# Patient Record
Sex: Female | Born: 1970 | Race: Black or African American | Hispanic: No | Marital: Single | Smoking: Current every day smoker
Health system: Southern US, Community
[De-identification: ages and names within clinical notes are randomized; demographics above are authoritative.]

## PROBLEM LIST (undated history)

## (undated) DIAGNOSIS — D259 Leiomyoma of uterus, unspecified: Secondary | ICD-10-CM

## (undated) DIAGNOSIS — N920 Excessive and frequent menstruation with regular cycle: Secondary | ICD-10-CM

## (undated) DIAGNOSIS — M25569 Pain in unspecified knee: Secondary | ICD-10-CM

## (undated) DIAGNOSIS — J40 Bronchitis, not specified as acute or chronic: Secondary | ICD-10-CM

## (undated) DIAGNOSIS — D649 Anemia, unspecified: Secondary | ICD-10-CM

## (undated) HISTORY — PX: TUMOR EXCISION: SHX421

## (undated) HISTORY — PX: ABDOMINAL HYSTERECTOMY: SHX81

---

## 2000-06-19 ENCOUNTER — Ambulatory Visit (HOSPITAL_BASED_OUTPATIENT_CLINIC_OR_DEPARTMENT_OTHER): Admission: RE | Admit: 2000-06-19 | Discharge: 2000-06-20 | Payer: Self-pay | Admitting: Otolaryngology

## 2000-06-19 ENCOUNTER — Encounter (INDEPENDENT_AMBULATORY_CARE_PROVIDER_SITE_OTHER): Payer: Self-pay | Admitting: Specialist

## 2001-09-26 ENCOUNTER — Emergency Department (HOSPITAL_COMMUNITY): Admission: EM | Admit: 2001-09-26 | Discharge: 2001-09-26 | Payer: Self-pay | Admitting: Internal Medicine

## 2002-02-07 ENCOUNTER — Encounter: Payer: Self-pay | Admitting: *Deleted

## 2002-02-07 ENCOUNTER — Emergency Department (HOSPITAL_COMMUNITY): Admission: EM | Admit: 2002-02-07 | Discharge: 2002-02-08 | Payer: Self-pay | Admitting: *Deleted

## 2002-02-13 ENCOUNTER — Emergency Department (HOSPITAL_COMMUNITY): Admission: EM | Admit: 2002-02-13 | Discharge: 2002-02-13 | Payer: Self-pay | Admitting: Emergency Medicine

## 2002-08-11 ENCOUNTER — Emergency Department (HOSPITAL_COMMUNITY): Admission: EM | Admit: 2002-08-11 | Discharge: 2002-08-11 | Payer: Self-pay | Admitting: Internal Medicine

## 2002-09-04 ENCOUNTER — Emergency Department (HOSPITAL_COMMUNITY): Admission: EM | Admit: 2002-09-04 | Discharge: 2002-09-04 | Payer: Self-pay | Admitting: Emergency Medicine

## 2002-09-04 ENCOUNTER — Encounter: Payer: Self-pay | Admitting: Emergency Medicine

## 2002-11-12 ENCOUNTER — Emergency Department (HOSPITAL_COMMUNITY): Admission: EM | Admit: 2002-11-12 | Discharge: 2002-11-12 | Payer: Self-pay | Admitting: Emergency Medicine

## 2002-11-12 ENCOUNTER — Encounter: Payer: Self-pay | Admitting: Emergency Medicine

## 2005-07-22 ENCOUNTER — Emergency Department (HOSPITAL_COMMUNITY): Admission: EM | Admit: 2005-07-22 | Discharge: 2005-07-22 | Payer: Self-pay | Admitting: Emergency Medicine

## 2006-07-02 ENCOUNTER — Inpatient Hospital Stay (HOSPITAL_COMMUNITY): Admission: RE | Admit: 2006-07-02 | Discharge: 2006-07-05 | Payer: Self-pay | Admitting: Obstetrics and Gynecology

## 2006-07-02 ENCOUNTER — Encounter (INDEPENDENT_AMBULATORY_CARE_PROVIDER_SITE_OTHER): Payer: Self-pay | Admitting: Specialist

## 2006-07-07 ENCOUNTER — Inpatient Hospital Stay (HOSPITAL_COMMUNITY): Admission: EM | Admit: 2006-07-07 | Discharge: 2006-07-13 | Payer: Self-pay | Admitting: Emergency Medicine

## 2006-07-21 ENCOUNTER — Ambulatory Visit (HOSPITAL_COMMUNITY): Admission: RE | Admit: 2006-07-21 | Discharge: 2006-07-21 | Payer: Self-pay | Admitting: Obstetrics and Gynecology

## 2009-05-22 ENCOUNTER — Emergency Department (HOSPITAL_COMMUNITY): Admission: EM | Admit: 2009-05-22 | Discharge: 2009-05-22 | Payer: Self-pay | Admitting: Emergency Medicine

## 2009-09-08 ENCOUNTER — Emergency Department (HOSPITAL_COMMUNITY): Admission: EM | Admit: 2009-09-08 | Discharge: 2009-09-08 | Payer: Self-pay | Admitting: Emergency Medicine

## 2009-11-17 ENCOUNTER — Emergency Department (HOSPITAL_COMMUNITY): Admission: EM | Admit: 2009-11-17 | Discharge: 2009-11-17 | Payer: Self-pay | Admitting: Emergency Medicine

## 2010-02-09 ENCOUNTER — Emergency Department (HOSPITAL_COMMUNITY): Admission: EM | Admit: 2010-02-09 | Discharge: 2010-02-09 | Payer: Self-pay | Admitting: Emergency Medicine

## 2010-03-05 ENCOUNTER — Emergency Department (HOSPITAL_COMMUNITY): Admission: EM | Admit: 2010-03-05 | Discharge: 2010-03-05 | Payer: Self-pay | Admitting: Emergency Medicine

## 2010-05-19 ENCOUNTER — Emergency Department (HOSPITAL_COMMUNITY)
Admission: EM | Admit: 2010-05-19 | Discharge: 2010-05-19 | Payer: Self-pay | Source: Home / Self Care | Admitting: Emergency Medicine

## 2010-07-23 ENCOUNTER — Emergency Department (HOSPITAL_COMMUNITY): Admission: EM | Admit: 2010-07-23 | Discharge: 2010-07-23 | Payer: Self-pay | Admitting: Emergency Medicine

## 2010-11-18 ENCOUNTER — Emergency Department (HOSPITAL_COMMUNITY)
Admission: EM | Admit: 2010-11-18 | Discharge: 2010-11-18 | Payer: Self-pay | Source: Home / Self Care | Admitting: Emergency Medicine

## 2011-01-27 LAB — DIFFERENTIAL
Basophils Absolute: 0 10*3/uL (ref 0.0–0.1)
Basophils Relative: 0 % (ref 0–1)
Eosinophils Relative: 7 % — ABNORMAL HIGH (ref 0–5)
Lymphs Abs: 3.4 10*3/uL (ref 0.7–4.0)
Monocytes Relative: 6 % (ref 3–12)
Neutrophils Relative %: 41 % — ABNORMAL LOW (ref 43–77)

## 2011-01-27 LAB — BASIC METABOLIC PANEL
BUN: 7 mg/dL (ref 6–23)
Calcium: 10.1 mg/dL (ref 8.4–10.5)
Glucose, Bld: 103 mg/dL — ABNORMAL HIGH (ref 70–99)

## 2011-01-27 LAB — POCT CARDIAC MARKERS: Myoglobin, poc: 40.5 ng/mL (ref 12–200)

## 2011-01-27 LAB — CBC: Platelets: 258 10*3/uL (ref 150–400)

## 2011-02-04 LAB — URINALYSIS, ROUTINE W REFLEX MICROSCOPIC
Ketones, ur: NEGATIVE mg/dL
Specific Gravity, Urine: <1.005 — ABNORMAL LOW (ref 1.005–1.030)
Urobilinogen, UA: 0.2 mg/dL (ref 0.0–1.0)

## 2011-02-04 LAB — WET PREP, GENITAL
Trich, Wet Prep: NONE SEEN
Yeast Wet Prep HPF POC: NONE SEEN

## 2011-02-23 LAB — COMPREHENSIVE METABOLIC PANEL
Albumin: 3.8 g/dL (ref 3.5–5.2)
CO2: 24 mEq/L (ref 19–32)
Calcium: 9.6 mg/dL (ref 8.4–10.5)
Creatinine, Ser: 0.68 mg/dL (ref 0.4–1.2)
GFR calc Af Amer: >60 mL/min (ref >60)
GFR calc non Af Amer: >60 mL/min (ref >60)
Potassium: 3.7 mEq/L (ref 3.5–5.1)
Total Protein: 7.1 g/dL (ref 6.0–8.3)

## 2011-02-23 LAB — POCT CARDIAC MARKERS: Myoglobin, poc: 29.8 ng/mL (ref 12–200)

## 2011-02-23 LAB — DIFFERENTIAL
Eosinophils Absolute: 0.6 10*3/uL (ref 0.0–0.7)
Lymphs Abs: 3.7 10*3/uL (ref 0.7–4.0)
Neutro Abs: 3.9 10*3/uL (ref 1.7–7.7)

## 2011-02-23 LAB — CBC
Hemoglobin: 12.6 g/dL (ref 12.0–15.0)
RBC: 4.4 MIL/uL (ref 3.87–5.11)
RDW: 14.6 % (ref 11.5–15.5)

## 2011-02-23 LAB — PROTIME-INR
INR: 1 (ref 0.00–1.49)
Prothrombin Time: 13.4 seconds (ref 11.6–15.2)

## 2011-04-04 NOTE — H&P (Signed)
Danielle Bonilla, Danielle Bonilla NO.:  [id number]   MEDICAL RECORD NO.:  [id number]          PATIENT TYPE:  INP   LOCATION:  A417                          FACILITY:  [facility]   PHYSICIAN:  Tilda Burrow, M.D. DATE OF BIRTH:  16-Apr-1971   DATE OF ADMISSION:  07/02/2006  DATE OF DISCHARGE:  08/19/2007LH                                HISTORY & PHYSICAL   ADMITTING DIAGNOSES:  1. Uterine fibroids.  2. Dysmenorrhea.   HISTORY OF PRESENT ILLNESS:  This 40 year old female, gravida 2, para 2, is  admitted at this time for an abdominal hysterectomy.  She has been referred  back to our office after being seen at [facility] for  uterine fibroids and menorrhagia.  She is anemic secondary to the heaviness  of her periods with documented hemoglobin of 9.4.  She has left work due to  this discomfort.  She was seen in the emergency room at [facility] on  June 15, 2006 with white count 9400, hemoglobin 9.4, as well, with  hematocrit of 30% with hypochromic microcytic indices.  Urinalysis is  notable only for blood.  Transabdominal ultrasound was performed with  estimated uterine volume of 249 cc, mildly enlarged.  Right and left ovaries  are normal.  There are multiple fibroids present.  The right ovary has a  small cyst.  The patient has had a visit to our office where we confirmed  the uterine size and discussed options.  The patient wishes to proceed with  a hysterectomy.  Pros and cons of ovarian preservation have been discussed.  Pap smear performed June 26, 2006.  She is not a candidate for vaginal  hysterectomy.   PAST MEDICAL HISTORY:  Benign.  Negative for diabetes, hypertension, heart  disease, kidney disease or seizures.  The last menstrual period began June 15, 2006.  She works at [employer].  She smokes a half pack of  cigarettes per day.  No drugs, no alcohol.   PHYSICAL EXAMINATION:  GENERAL:  A healthy, large-framed African-American  female.  Alert and oriented x3.  VITAL SIGNS:  Weight 216.  Blood pressure 124/64, pulse 70s.  HEENT:  Pupils equal and round.  NECK:  Supple.  CHEST:  Clear to auscultation.  BREAST EXAM:  Deferred.  ABDOMEN:  Nontender.  PELVIC:  External genitalia normal.  Good vaginal length.  Good support of  the cervix.  Pap smear performed.  Uterus anteflexed, 10 weeks size.  Adnexa  nontender.  Cervix non-purulent.  GC and Chlamydia cultures performed.   PLAN:  Abdominal hysterectomy on July 03, 2006.  Followup with labs on  June 30, 2006 at 10:00 a.m.      Tilda Burrow, M.D.  Electronically Signed     JVF/MEDQ  D:  06/28/2006  T:  06/28/2006  Job:  [id number]

## 2011-04-04 NOTE — Op Note (Signed)
[facility]  Patient:    Danielle Bonilla, Danielle Bonilla                         MRN: [id number] Proc. Date: 06/19/00 Adm. Date:  [id number] Disc. Date: [id number] Attending:  Serena Colonel H CC:         Billy L. Emelda Fear, M.D.                           Operative Report  PREOPERATIVE DIAGNOSIS:  Right parotid mass.  POSTOPERATIVE DIAGNOSIS:  Right parotid mass.  OPERATION PERFORMED:  Right superficial parotidectomy with dissection of the facial nerve.  SURGEON:  Jefry H. Pollyann Kennedy, M.D.  ASSISTANT:  Margit Banda. Jearld Fenton, M.D.  REFERRING PHYSICIAN:  Dr. Emelda Fear, Sidney Ace.  COMPLICATIONS:  None.  ESTIMATED BLOOD LOSS:  30 cc.  FINDINGS:  A smooth, firm mass in the anterior aspect of the right superficial lobe of the parotid adjacent to the buccal branch of the facial nerve.  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  The patient tolerated the procedure well, was awakened, extubated and transferred to recovery in stable condition.  INDICATIONS FOR PROCEDURE:  The patient is a 40 year old with a several month history of a slowly enlarging mass on the right side of the face.  The risks, benefits, alternatives and complications of the procedure were explained to the patient, who seemed to understand and agreed to surgery.  ANESTHESIA:  DESCRIPTION OF PROCEDURE:  The patient was taken to the operating room and placed on the operating table in the supine position.  Following the induction of general endotracheal anesthesia, the patient was prepped and draped in standard fashion.  A preauricular incision was outlined with a marking pen with extension down behind the mastoid process and down below the angle of the mandible.  A portion of this incision was used and the skin incision was created with electrocautery device.  A flap was developed anteriorly just superficial to the parotid fascia.  The earlobe was retracted posteriorly with a silk suture.   The parotid fascia was dissected off the mastoid process and the upper sternocleidomastoid and brought forward.  Dissection down along the auricular cartilage revealed the tympanosquamous suture line.  The posterior belly of the digastric muscle was identified.  Careful dissection in this area was then performed and the main trunk of the facial nerve was identified. Careful dissection with a McCabe dissector, bipolar cautery and sharp scissor dissection was then accomplished dissecting off all branches of the facial nerve from the main trunk through the pes anserinus and then along the peripheral branches.  Three large branches were dissected off the upper division and three off the lower division.  The parotid gland was brought forward.  The lower part of the gland was transected, bringing the ____________ up superiorly towards where the tumor was located.  Careful dissection around the buccal branch was then accomplished and the mass was removed with the superficial lobe in its entirety.  The wound was irrigated with saline.  4-0 silk suture ties and electrocautery were used as needed for hemostasis.  The main trunk was stimulatable with a nerve stimulator at 1 milliamp and each of the main divisions was stimulatable with half a milliamp. The wound was closed in layers using 4-0 chromic on the subcutaneous layer and 5-0 running nylon on the skin.  A 7 French round Jackson-Pratt drain was left in the wound,  exited through the inferior aspect of the incision, secured in place with a silk suture.  The patient was awakened from anesthesia, transferred to recovery in stable condition. DD:  06/19/00 TD:  06/22/00 Job: [id number]

## 2011-04-04 NOTE — Discharge Summary (Signed)
[name], Danielle Bonilla NO.:  [id number]   MEDICAL RECORD NO.:  [id number]          PATIENT TYPE:  INP   LOCATION:  A318                          FACILITY:  [facility]   PHYSICIAN:  Lazaro Arms, M.D.   DATE OF BIRTH:  [birth year]   DATE OF ADMISSION:  07/07/2006  DATE OF DISCHARGE:  08/27/2007LH                                 DISCHARGE SUMMARY   DISCHARGE DIAGNOSES:  1. Status post abdominal hysterectomy with postoperative infection.  2. Postoperative ileus.   PROCEDURE:  Admission to the hospital with daily care and discharge  management.   Please refer to the transcribed History and Physical for details on  admission to the hospital.   HOSPITAL COURSE:  The patient was admitted with fever, increased abdominal  pain, nausea and vomiting and bloating.  She appeared to have a  postoperative ileus.  Dr. Emelda Fear admitted her.  She underwent CT scan  which showed a small collection of fluid at the top of the cuff and  temperatures in the low 100 range.  She was begun on gentamicin and Cleocin.  She had a culture done of her drain site and that returned as Acinetobacter  as well as Enterococcus.  I then transitioned her from gentamicin and  Cleocin to oral Cipro and ampicillin as one antibiotic would not cover both  of the bacteria.  She defervesced, her abdominal exam has become benign, she  is eating well, having normal flatus and bowel movements.  Her acute and  upright abdomen also shows significant diminishment in her air fluid levels  with resolving ileus.  She is ambulatory and tolerating oral pain medicine.  As a result, she is discharged to home on ciprofloxacin 500 mg twice a day  for 10 days and ampicillin 500 three times a day for 10 days.  She will be  seen back in the office in 4 days for interval evaluation.  Staples  were  removed, Steri-Strips were placed and her incision was clean, dry and intact  prior to discharge.  She has hydrocodone at home as  needed for pain.  She is  instructed to call the office in the meantime should other problems arise.      Lazaro Arms, M.D.  Electronically Signed     LHE/MEDQ  D:  07/13/2006  T:  07/13/2006  Job:  [id number]

## 2011-04-04 NOTE — Discharge Summary (Signed)
Danielle Bonilla, Danielle Bonilla NO.:  [id number]   MEDICAL RECORD NO.:  [id number]          PATIENT TYPE:  INP   LOCATION:  A417                          FACILITY:  [facility]   PHYSICIAN:  Lazaro Arms, M.D.   DATE OF BIRTH:  1971-09-01   DATE OF ADMISSION:  07/02/2006  DATE OF DISCHARGE:  08/19/2007LH                                 DISCHARGE SUMMARY   DISCHARGE DIAGNOSES:  1. Status post abdominal hysterectomy for uterine fibroids and      menorrhagia.  2. Anemia.  3. Unremarkable postoperative course.   PROCEDURE:  Abdominal hysterectomy.   HISTORY OF PRESENT ILLNESS:  Please refer to the transcribed history and  physical and the operative report for details of admission to the hospital.   HOSPITAL COURSE:  The patient was admitted after surgery. Intraoperative  course was unremarkable. She did have about 750 cc of intraoperative blood  loss. She had a Jackson-Pratt drain place, which has put out minimally  postoperatively. Her hemoglobin preoperative was 9.4 and 29.8. She had an I-  STAT done just before surgery that was 12.2 and 36 but I think that was  spurious because her postoperative day #1 was 9.7 and 30.0. Postoperative  day 2 was 8.2 and 25.4. Postoperative day 3, it was 7.9 and 24.5. White  count was unremarkable. She is ambulatory. She is tolerating clear liquids  and a regular diet. She has had progression with normal bowel function. She  has transitioned to oral pain medicine without difficulty. She is discharged  to home on the morning of postoperative day 3 on Tylox, iron, and Benadryl  associated with Tylox. She will be seen back in the office on the 22nd for  incision evaluation. Her Jackson-Pratt drain is left in and is putting out  about 30 cc of serous sanguinous fluid a day. Her incision again is clean  dry and intact. She is given instructions and precautions for return prior  to scheduled appointment.      Lazaro Arms, M.D.  Electronically Signed     LHE/MEDQ  D:  07/05/2006  T:  07/05/2006  Job:  [id number]

## 2011-04-04 NOTE — Op Note (Signed)
[name], Danielle Bonilla NO.:  [id number]   MEDICAL RECORD NO.:  [id number]          PATIENT TYPE:  INP   LOCATION:  A417                          FACILITY:  [facility]   PHYSICIAN:  Tilda Burrow, M.D. DATE OF BIRTH:  03/16/1971   DATE OF PROCEDURE:  07/02/2006  DATE OF DISCHARGE:                                 OPERATIVE REPORT   PREOPERATIVE DIAGNOSES:  1. Uterine fibroid.  2. Dysmenorrhea.  3. Menorrhagia.  4. Anemia.   POSTOPERATIVE DIAGNOSES:  1. Uterine fibroid.  2. Dysmenorrhea.  3. Menorrhagia.  4. Anemia.   PROCEDURE:  Total abdominal hysterectomy.   SURGEON:  Tilda Burrow, M.D.   ASSISTANT:  None.   ANESTHESIA:  General.   COMPLICATIONS:  None.   FINDINGS:  Generous vascularity throughout case with increased blood loss  from multiple sources.   INDICATION:  [age]-year-old female with documented anemia, hemoglobin  of 9.4 after heavy menstrual flows with ultrasound confirmation of multiple  fibroids.   DETAILS OF PROCEDURE:  The patient was taken to the operating room and  prepped and draped for lower abdominal surgery.  A Pfannenstiel-type  incision was performed, removing a 2-inch-wide ellipse of skin and  underlying fatty tissue to improve access to the pelvis.  The Pfannenstiel  incision was otherwise uneventful and the peritoneal cavity was opened  bluntly and bowel packed away.  A Balfour retractor was then placed.  The  uterus was enlarged, consistent with prior ultrasounds.  Lahey thyroid  tenaculum was initially used, but the bleeding around the tenaculum was  excessive.  We to [facility] clamps.  Round ligaments were taken down on either  side with suture ligature and then transected.  Bladder flap was developed  anteriorly; again, it was quite vascular.  The utero-ovarian ligaments could  be isolated, doubly clamped, cut and suture-ligated on each side.  Because  they were quite broad, we required resuturing of the right  uterine side of  this ligament in order to keep hemostasis.  Uterine vessels were then doubly  clamped with curved Heaney clamps with a Kelly clamp placed to control  backbleeding, and the uterine vessels doubly tied on each side.  Straight  Heaney clamps were used on the upper cardinal ligaments, clamping, cutting  and suture-ligating.  The cervix was quite broad, almost at least 4-5 cm in  transverse direction, and a stab incision was made in the anterior  cervicovaginal fornix and the cervix amputated off the cuff.  There were  multiple large collateral vessels in the cuff and multiple Kocher clamps  were used to control the edges.  All the stitches were placed at each  lateral vaginal angle.  Due to the large diameter of the cuff opening, a  reducing figure-of-eight suture was placed anteriorly and posterior on the  circumference of the cuff in order to reduce the cuff diameter, then the  remaining portion of the cuff was closed with a running-locking 0 chromic.  Hemostasis became satisfactory.  Pelvis was irrigated, hemostasis considered  adequate.  Bladder flap was reapproximated with 2-0 chromic in interrupted  fashion.  Laparotomy equipment was removed.  Counts were correct.  Anterior  peritoneum was closed with 2-0 chromic and the patient then went to recovery  room in stable condition.   ADDENDUM:  The patient did receive 1 unit of transfusion intraoperatively.      Tilda Burrow, M.D.  Electronically Signed     JVF/MEDQ  D:  07/03/2006  T:  07/03/2006  Job:  [id number]   cc:   [facility]

## 2011-04-04 NOTE — H&P (Signed)
Danielle Bonilla, Danielle Bonilla NO.:  [id number]   MEDICAL RECORD NO.:  [id number]          PATIENT TYPE:  INP   LOCATION:  A318                          FACILITY:  [facility]   PHYSICIAN:  Tilda Burrow, M.D. DATE OF BIRTH:  10/09/1971   DATE OF ADMISSION:  07/07/2006  DATE OF DISCHARGE:  LH                                HISTORY & PHYSICAL   TIME OF ADMISSION:  4 a.m.   ADMITTING DIAGNOSES:  1. Postoperative ileus, rule out postoperative hematoma.  2. Anemia.   HISTORY OF PRESENT ILLNESS:  This 40 year old female, now just 2 days since  her recent discharge after an abdominal hysterectomy notable for generous  varicosities around the pelvic cuff with 600-mL estimated blood loss and  single-unit transfusion intraoperatively, is admitted after presenting to  the emergency room complaining of abdominal discomfort.  She was seen by Dr.  Margretta Ditty with evaluation including hemoglobin of 9.7 and hematocrit 29.8,  consistent with her prior hospitalization lab result.  White count was 9900.  She had abdominal pain and discomfort and was admitted for management of the  abdominal pain, nausea and vomiting.  She had only begun to vomit 8 hours  prior to admission.  She had several episodes of yellow emesis with patient  afebrile at the time of admission with bowel sounds, soft, no masses.  Bowel  sounds were present by emergency room physician evaluation.   HOSPITAL COURSE:  The patient was admitted and placed on clear liquids and  monitored on the morning and was seen me by the following morning, where  abdominal exam showed scanty bowel sounds, slight abdominal fullness  compared to immediately postop.  She is admitted for antibiotic therapy to  rule out an infectious etiology and monitoring of abdominal status.  No suspicion of perforation is there; presumed diagnosis is ileus.   PLAN:  We will monitor for recovery of bowel function and culture incision  and blood for signs of  infection.      Tilda Burrow, M.D.  Electronically Signed     JVF/MEDQ  D:  07/10/2006  T:  07/10/2006  Job:  [id number]   cc:   [facility]

## 2011-08-20 ENCOUNTER — Emergency Department (HOSPITAL_COMMUNITY)
Admission: EM | Admit: 2011-08-20 | Discharge: 2011-08-20 | Disposition: A | Discharge status: Home / Self Care | Payer: Self-pay | Attending: Emergency Medicine | Admitting: Emergency Medicine

## 2011-08-20 ENCOUNTER — Emergency Department (HOSPITAL_COMMUNITY): Payer: Self-pay

## 2011-08-20 ENCOUNTER — Encounter: Payer: Self-pay | Admitting: Emergency Medicine

## 2011-08-20 DIAGNOSIS — J069 Acute upper respiratory infection, unspecified: Secondary | ICD-10-CM | POA: Insufficient documentation

## 2011-08-20 DIAGNOSIS — R51 Headache: Secondary | ICD-10-CM | POA: Insufficient documentation

## 2011-08-20 DIAGNOSIS — R070 Pain in throat: Secondary | ICD-10-CM | POA: Insufficient documentation

## 2011-08-20 DIAGNOSIS — R599 Enlarged lymph nodes, unspecified: Secondary | ICD-10-CM | POA: Insufficient documentation

## 2011-08-20 DIAGNOSIS — R110 Nausea: Secondary | ICD-10-CM | POA: Insufficient documentation

## 2011-08-20 DIAGNOSIS — R6883 Chills (without fever): Secondary | ICD-10-CM | POA: Insufficient documentation

## 2011-08-20 HISTORY — DX: Bronchitis, not specified as acute or chronic: J40

## 2011-08-20 LAB — RAPID STREP SCREEN (MED CTR MEBANE ONLY): Streptococcus, Group A Screen (Direct): NEGATIVE

## 2011-08-20 MED ORDER — HYDROCOD POLST-CHLORPHEN POLST 10-8 MG/5ML PO LQCR
5.0000 mL | Freq: Once | ORAL | Status: AC
  Administered 2011-08-20: 5 mL via ORAL
  Filled 2011-08-20: qty 5

## 2011-08-20 MED ORDER — IBUPROFEN 800 MG PO TABS
800.0000 mg | ORAL_TABLET | Freq: Once | ORAL | Status: AC
  Administered 2011-08-20: 800 mg via ORAL
  Filled 2011-08-20: qty 1

## 2011-08-20 MED ORDER — PROMETHAZINE-CODEINE 6.25-10 MG/5ML PO SYRP: 5.0000 mL | ORAL_SOLUTION | ORAL | Status: AC | PRN

## 2011-08-20 MED ORDER — FEXOFENADINE-PSEUDOEPHED ER 60-120 MG PO TB12: 1.0000 | ORAL_TABLET | Freq: Two times a day (BID) | ORAL | Status: DC

## 2011-08-20 NOTE — ED Provider Notes (Signed)
Medical screening examination/treatment/procedure(s) were performed by non-physician practitioner and as supervising physician I was immediately available for consultation/collaboration.   Benny Lennert, MD 08/20/11 [phone]

## 2011-08-20 NOTE — ED Notes (Signed)
Patient to xray at this time

## 2011-08-20 NOTE — ED Notes (Signed)
Pt c/o chills, headache, back pain, and sore throat since yesterday.

## 2011-08-20 NOTE — ED Provider Notes (Signed)
History     CSN: [id number] Arrival date & time: 08/20/2011  5:07 PM  Chief Complaint  Patient presents with  . Chills  . Headache  . Sore Throat    (Consider location/radiation/quality/duration/timing/severity/associated sxs/prior treatment) HPI Comments: Pt c/o bodyaches, headache, sore throat, and chill since yesterday. She is unsure of temp elevations. States she fell as if beat up.  Patient is a 40 y.o. female presenting with headaches and pharyngitis. The history is provided by the patient.  Headache  This is a new problem. The current episode started yesterday. The problem occurs constantly. The problem has been gradually worsening. The pain is located in the frontal region. The quality of the pain is described as throbbing. The pain is moderate. Associated symptoms include a fever and nausea.  Sore Throat Associated symptoms include a fever, headaches and nausea.    Past Medical History  Diagnosis Date  . Bronchitis     Past Surgical History  Procedure Date  . Abdominal hysterectomy     History reviewed. No pertinent family history.  History  Substance Use Topics  . Smoking status: Current Everyday Smoker    Types: Cigarettes  . Smokeless tobacco: Not on file  . Alcohol Use: No    OB History    Grav Para Term Preterm Abortions TAB SAB Ect Mult Living                  Review of Systems  Constitutional: Positive for fever.  Gastrointestinal: Positive for nausea.  Neurological: Positive for headaches.    Allergies  Pineapple  Home Medications  No current outpatient prescriptions on file.  BP 107/71  Pulse 88  Temp(Src) 99.9 F (37.7 C) (Oral)  Resp 20  Ht 5\' 7"  (1.702 m)  Wt 235 lb (106.595 kg)  BMI 36.81 kg/m2  SpO2 95%  Physical Exam  Nursing note and vitals reviewed. Constitutional: She is oriented to person, place, and time. She appears well-developed and well-nourished.  Non-toxic appearance.  HENT:  Head: Normocephalic.  Right Ear:  Tympanic membrane and external ear normal.  Left Ear: Tympanic membrane and external ear normal.       Nasal congestion noted. Mod increase redness of the posterior pharynx.  Eyes: EOM and lids are normal. Pupils are equal, round, and reactive to light.  Neck: Normal range of motion. Neck supple. Carotid bruit is not present. No tracheal deviation present.  Cardiovascular: Normal rate, regular rhythm, normal heart sounds, intact distal pulses and normal pulses.   Pulmonary/Chest: No respiratory distress.       Course breath sounds. No wheeze or rhonchi.  Abdominal: Soft. Bowel sounds are normal. There is no tenderness. There is no guarding.  Musculoskeletal: Normal range of motion.       No hot joints.  Lymphadenopathy:       Head (right side): No submandibular adenopathy present.       Head (left side): No submandibular adenopathy present.    She has cervical adenopathy.  Neurological: She is alert and oriented to person, place, and time. She has normal strength. No cranial nerve deficit or sensory deficit.  Skin: Skin is warm and dry.  Psychiatric: She has a normal mood and affect. Her speech is normal.    ED Course  Procedures (including critical care time)  Labs Reviewed - No data to display No results found.   Dx: URI   MDM  I have reviewed nursing notes, vital signs, and all appropriate lab and imaging results for  this patient.        Kathie Dike, [place] 08/20/11 Kristopher Oppenheim

## 2011-12-20 ENCOUNTER — Emergency Department (HOSPITAL_COMMUNITY): Payer: Self-pay

## 2011-12-20 ENCOUNTER — Encounter (HOSPITAL_COMMUNITY): Payer: Self-pay | Admitting: Emergency Medicine

## 2011-12-20 ENCOUNTER — Other Ambulatory Visit: Payer: Self-pay

## 2011-12-20 ENCOUNTER — Emergency Department (HOSPITAL_COMMUNITY)
Admission: EM | Admit: 2011-12-20 | Discharge: 2011-12-20 | Disposition: A | Discharge status: Home / Self Care | Payer: Self-pay | Attending: Emergency Medicine | Admitting: Emergency Medicine

## 2011-12-20 DIAGNOSIS — R059 Cough, unspecified: Secondary | ICD-10-CM | POA: Insufficient documentation

## 2011-12-20 DIAGNOSIS — J45909 Unspecified asthma, uncomplicated: Secondary | ICD-10-CM | POA: Insufficient documentation

## 2011-12-20 DIAGNOSIS — F17200 Nicotine dependence, unspecified, uncomplicated: Secondary | ICD-10-CM | POA: Insufficient documentation

## 2011-12-20 DIAGNOSIS — R0789 Other chest pain: Secondary | ICD-10-CM | POA: Insufficient documentation

## 2011-12-20 DIAGNOSIS — J3489 Other specified disorders of nose and nasal sinuses: Secondary | ICD-10-CM | POA: Insufficient documentation

## 2011-12-20 DIAGNOSIS — Z9079 Acquired absence of other genital organ(s): Secondary | ICD-10-CM | POA: Insufficient documentation

## 2011-12-20 DIAGNOSIS — R05 Cough: Secondary | ICD-10-CM

## 2011-12-20 DIAGNOSIS — R110 Nausea: Secondary | ICD-10-CM | POA: Insufficient documentation

## 2011-12-20 MED ORDER — ALBUTEROL SULFATE (5 MG/ML) 0.5% IN NEBU
5.0000 mg | INHALATION_SOLUTION | Freq: Once | RESPIRATORY_TRACT | Status: AC
  Administered 2011-12-20: 5 mg via RESPIRATORY_TRACT
  Filled 2011-12-20: qty 1

## 2011-12-20 MED ORDER — IPRATROPIUM BROMIDE 0.02 % IN SOLN
0.5000 mg | Freq: Once | RESPIRATORY_TRACT | Status: AC
  Administered 2011-12-20: 0.5 mg via RESPIRATORY_TRACT
  Filled 2011-12-20: qty 2.5

## 2011-12-20 MED ORDER — ALBUTEROL SULFATE HFA 108 (90 BASE) MCG/ACT IN AERS
2.0000 | INHALATION_SPRAY | Freq: Once | RESPIRATORY_TRACT | Status: AC
  Administered 2011-12-20: 2 via RESPIRATORY_TRACT
  Filled 2011-12-20: qty 6.7

## 2011-12-20 MED ORDER — GUAIFENESIN-CODEINE 100-10 MG/5ML PO SYRP: 10.0000 mL | ORAL_SOLUTION | Freq: Three times a day (TID) | ORAL | Status: AC | PRN

## 2011-12-20 MED ORDER — KETOROLAC TROMETHAMINE 30 MG/ML IJ SOLN
30.0000 mg | Freq: Once | INTRAMUSCULAR | Status: AC
  Administered 2011-12-20: 30 mg via INTRAVENOUS
  Filled 2011-12-20: qty 1

## 2011-12-20 MED ORDER — PREDNISONE 10 MG PO TABS: ORAL_TABLET | ORAL | Status: DC

## 2011-12-20 MED ORDER — AZITHROMYCIN 250 MG PO TABS: ORAL_TABLET | ORAL | Status: DC

## 2011-12-20 MED ORDER — ONDANSETRON HCL 4 MG/2ML IJ SOLN
4.0000 mg | Freq: Once | INTRAMUSCULAR | Status: AC
  Administered 2011-12-20: 4 mg via INTRAVENOUS
  Filled 2011-12-20: qty 2

## 2011-12-20 NOTE — ED Provider Notes (Signed)
History     CSN: [id number]  Arrival date & time 12/20/11  1125   First MD Initiated Contact with Patient 12/20/11 1218      Chief Complaint  Patient presents with  . Cough  . Chest Pain    (Consider location/radiation/quality/duration/timing/severity/associated sxs/prior treatment) HPI Comments: patient c/o  cough and nasal congestion for one week.  States the cough has been intermittent and occasionally productive.  Also c/o pain to the left chest wall with excessive cough.  Denies fever, hemoptysis, shortness of breath,.  Has used otc cold medications without improvement.     Patient is a 41 y.o. female presenting with cough. The history is provided by the patient. No language interpreter was used.  Cough This is a new problem. The current episode started more than 2 days ago. The problem occurs every few minutes. The problem has not changed since onset.The cough is productive of sputum. There has been no fever. Associated symptoms include chest pain, chills and rhinorrhea. Pertinent negatives include no headaches, no sore throat, no myalgias, no shortness of breath and no wheezing. She has tried decongestants and cough syrup for the symptoms. The treatment provided no relief. She is a smoker. Her past medical history is significant for bronchitis and asthma. Her past medical history does not include pneumonia.    Past Medical History  Diagnosis Date  . Bronchitis   . Asthma     Past Surgical History  Procedure Date  . Abdominal hysterectomy   . Tumor excision     Family History  Problem Relation Age of Onset  . Cancer Father   . Diabetes Father     History  Substance Use Topics  . Smoking status: Current Everyday Smoker    Types: Cigarettes  . Smokeless tobacco: Not on file  . Alcohol Use: No    OB History    Grav Para Term Preterm Abortions TAB SAB Ect Mult Living   4 4 4              Review of Systems  Constitutional: Positive for chills. Negative for fever  and appetite change.  HENT: Positive for congestion and rhinorrhea. Negative for sore throat, trouble swallowing, neck pain and neck stiffness.   Respiratory: Positive for cough and chest tightness. Negative for shortness of breath and wheezing.   Cardiovascular: Positive for chest pain. Negative for palpitations.  Gastrointestinal: Negative for nausea, vomiting and abdominal pain.  Genitourinary: Negative for dysuria and hematuria.  Musculoskeletal: Negative for myalgias, back pain and arthralgias.  Skin: Negative for rash.  Neurological: Negative for dizziness, weakness, numbness and headaches.  Hematological: Does not bruise/bleed easily.  All other systems reviewed and are negative.    Allergies  Latex and Pineapple  Home Medications   Current Outpatient Rx  Name Route Sig Dispense Refill  . IBUPROFEN 200 MG PO TABS Oral Take 400 mg by mouth every 8 (eight) hours as needed. migraines    . DAYQUIL/NYQUIL COLD/FLU RELIEF PO Oral Take 5 mLs by mouth 2 (two) times daily.      BP 103/78  Pulse 81  Temp(Src) 98 F (36.7 C) (Oral)  Resp 20  Ht 5\' 7"  (1.702 m)  Wt 242 lb (109.77 kg)  BMI 37.90 kg/m2  SpO2 99%  Physical Exam  Nursing note and vitals reviewed. Constitutional: She is oriented to person, place, and time. She appears well-developed and well-nourished. No distress.  HENT:  Head: Normocephalic and atraumatic.  Mouth/Throat: Oropharynx is clear and moist.  Neck: Normal range of motion. Neck supple.  Cardiovascular: Normal rate, regular rhythm and normal heart sounds.   Pulmonary/Chest: Effort normal. No respiratory distress. She has no wheezes. She has rhonchi. She has no rales. She exhibits tenderness.  Musculoskeletal: Normal range of motion. She exhibits no edema and no tenderness.  Lymphadenopathy:    She has no cervical adenopathy.  Neurological: She is alert and oriented to person, place, and time. She exhibits normal muscle tone. Coordination normal.  Skin:  Skin is warm and dry.    ED Course  Procedures (including critical care time)  Labs Reviewed - No data to display Dg Chest 2 View  12/20/2011  *RADIOLOGY REPORT*  Clinical Data: Cough and fever  CHEST - 2 VIEW  Comparison: 08/20/2011  Findings: Cardiomediastinal silhouette is within normal limits. The lungs are clear. No pleural effusion.  No pneumothorax.  No acute osseous abnormality.  IMPRESSION: Normal chest.  Original Report Authenticated By: Harrel Lemon, M.D.    Results for orders placed during the hospital encounter of 08/20/11  RAPID STREP SCREEN      Component Value Range   Streptococcus, Group A Screen (Direct) NEGATIVE  NEGATIVE        MDM   Date: 12/20/2011  Rate: 68  Rhythm: normal sinus rhythm  QRS Axis: normal  Intervals: normal  ST/T Wave abnormalities: normal  Conduction Disutrbances:none  Narrative Interpretation:   Old EKG Reviewed: none available  EKG read by Dr. Devoria Albe  Patient is alert, feeling better.  Non-toxic appearing.  Lung sounds improved after nebulizer treatment.  She agrees to close follow-up with her pmd or to return here if sx's worsen. l  Patient is feeling better.  Vitals stable.  No tachycardia, tachypnea or hypoxia to suggest PE.  Coarse breath sounds bilaterally that improved after neb.  She agrees to close f/u with the health dept or I have advised her to return here if sx's worsen      Quin Mathenia L. [place], [place] 12/21/11 2129

## 2011-12-20 NOTE — ED Notes (Signed)
Complain of cough and pain in left rib area for a week

## 2011-12-20 NOTE — ED Provider Notes (Signed)
Patient relates she's had cough with nausea for the past week. She denies diarrhea.  Patient's laying on her side in no distress. No wheezing noted.  Medical screening examination/treatment/procedure(s) were conducted as a shared visit with non-physician practitioner(s) and myself.  I personally evaluated the patient during the encounter Devoria Albe, MD, Franz Dell, MD 12/20/11 1330

## 2011-12-22 NOTE — ED Provider Notes (Signed)
See prior note   Ward Givens, MD 12/22/11 1758

## 2012-03-29 ENCOUNTER — Emergency Department (HOSPITAL_COMMUNITY)
Admission: EM | Admit: 2012-03-29 | Discharge: 2012-03-29 | Disposition: A | Discharge status: Home / Self Care | Payer: Self-pay | Attending: Emergency Medicine | Admitting: Emergency Medicine

## 2012-03-29 ENCOUNTER — Encounter (HOSPITAL_COMMUNITY): Payer: Self-pay | Admitting: *Deleted

## 2012-03-29 DIAGNOSIS — J45909 Unspecified asthma, uncomplicated: Secondary | ICD-10-CM | POA: Insufficient documentation

## 2012-03-29 DIAGNOSIS — Z79899 Other long term (current) drug therapy: Secondary | ICD-10-CM | POA: Insufficient documentation

## 2012-03-29 DIAGNOSIS — K089 Disorder of teeth and supporting structures, unspecified: Secondary | ICD-10-CM | POA: Insufficient documentation

## 2012-03-29 DIAGNOSIS — K029 Dental caries, unspecified: Secondary | ICD-10-CM | POA: Insufficient documentation

## 2012-03-29 DIAGNOSIS — F17200 Nicotine dependence, unspecified, uncomplicated: Secondary | ICD-10-CM | POA: Insufficient documentation

## 2012-03-29 DIAGNOSIS — K0889 Other specified disorders of teeth and supporting structures: Secondary | ICD-10-CM

## 2012-03-29 MED ORDER — HYDROCODONE-ACETAMINOPHEN 5-325 MG PO TABS: ORAL_TABLET | ORAL | Status: AC

## 2012-03-29 MED ORDER — PENICILLIN V POTASSIUM 250 MG PO TABS: 250.0000 mg | ORAL_TABLET | Freq: Four times a day (QID) | ORAL | Status: AC

## 2012-03-29 MED ORDER — OXYCODONE-ACETAMINOPHEN 5-325 MG PO TABS
1.0000 | ORAL_TABLET | Freq: Once | ORAL | Status: AC
  Administered 2012-03-29: 1 via ORAL
  Filled 2012-03-29: qty 1

## 2012-03-29 NOTE — Discharge Instructions (Signed)
RESOURCE GUIDE  Dental Problems  Patients with Medicaid: [facility]                     [facility] [address]                                           [address] Phone:  [phone]                                                  Phone:  [phone]  If unable to pay or uninsured, contact:  [facility] or [facility] to become qualified for the adult dental clinic.  Chronic Pain Problems Contact [facility]  [phone] Patients need to be referred by their primary care doctor.  Insufficient Money for Medicine Contact [employer]:  call "211" or [facility] 271-5999.  No Primary Care Doctor Call [facility]  832-8000 Other agencies that provide inexpensive medical care    [facility]  [phone]    [facility]  [phone]    [facility]  [phone]    Women[facility] Clinic  [phone]    [facility]  [phone]    [facility]  [phone]  Psychological Services [facility]  [phone] [facility]  [phone] [facility]   [phone] (emergency services [phone])  Substance Abuse Resources [facility]  336-882-2125 [facility] 336-784-9470 [facility] 336-285-9073 [facility] 336-845-3988 Residential & Outpatient Substance Abuse Program  800-659-3381  Abuse/Neglect [place] Child Abuse Hotline (336) 641-3795 [place] Child Abuse Hotline 800-378-5315 (After Hours)  Emergency Shelter [facility] (336) 271-5985  [facility] (336) 275-9566 [facility] (704) 372-4663  MRSA Hotline #:   832-7006    [place] [facility]  [facility] [place]     [employer]                          [facility] [address] [place]                       [address]      [address]  [place]                                                [place]                            [place] Phone:  [phone]                                   Phone:  [phone]                 Phone:  [phone]  [facility] Phone:  [phone]    [place] Child Abuse Hotline (336) 342-1394 (336) 342-3537 (After Hours)    Take the prescriptions as directed.  Call your regular dentist today to schedule a follow up appointment within the next week.  Return to the Emergency Department immediately sooner if worsening.   

## 2012-03-29 NOTE — ED Provider Notes (Signed)
History   This chart was scribed for Danielle Anger, DO by Brooks Sailors. The patient was seen in room APA12/APA12. Patient's care was started at 0808.   CSN: [id number]  Arrival date & time 03/29/12  0808   First MD Initiated Contact with Patient 03/29/12 [phone]      Chief Complaint  Patient presents with  . Dental Pain     HPI Pt seen at 0808.  Per pt, c/o gradual onset and persistence of constant left upper tooth "pain" for the past several days.  States the pain radiates into her face and throat.  Denies fevers, no intra-oral edema, no rash, no facial swelling, no dysphagia, no neck pain.   The condition is aggravated by nothing. The condition is relieved by nothing.  The patient has no significant history of serious medical conditions.     Past Medical History  Diagnosis Date  . Bronchitis   . Asthma     Past Surgical History  Procedure Date  . Abdominal hysterectomy   . Tumor excision     Family History  Problem Relation Age of Onset  . Cancer Father   . Diabetes Father     History  Substance Use Topics  . Smoking status: Current Everyday Smoker    Types: Cigarettes  . Smokeless tobacco: Not on file  . Alcohol Use: No    OB History    Grav Para Term Preterm Abortions TAB SAB Ect Mult Living   4 4 4              Review of Systems ROS: Statement: All systems negative except as marked or noted in the HPI; Constitutional: Negative for fever and chills. ; ; Eyes: Negative for eye pain and discharge. ; ; ENMT: Positive for dental caries, dental hygiene poor and toothache. Negative for ear pain, bleeding gums, dental injury, facial deformity, facial swelling, hoarseness, nasal congestion, sinus pressure, throat swelling and tongue swollen. ; ; Cardiovascular: Negative for chest pain, palpitations, diaphoresis, dyspnea and peripheral edema. ; ; Respiratory: Negative for cough, wheezing and stridor. ; ; Gastrointestinal: Negative for nausea, vomiting, diarrhea and  abdominal pain. ; ; Genitourinary: Negative for dysuria, flank pain and hematuria. ; ; Musculoskeletal: Negative for back pain and neck pain. ; ; Skin: Negative for rash and skin lesion. ; ; Neuro: Negative for headache, lightheadedness and neck stiffness. ;    Allergies  Latex and Pineapple  Home Medications   Current Outpatient Rx  Name Route Sig Dispense Refill  . AZITHROMYCIN 250 MG PO TABS  Take two tablets on day one, then one tab qd days 2-5 Take first 2 tablets together, then 1 every day until finished. 6 tablet 0  . HYDROCODONE-ACETAMINOPHEN 5-325 MG PO TABS  1 or 2 tabs PO q4-6 hours prn pain 20 tablet 0  . IBUPROFEN 200 MG PO TABS Oral Take 400 mg by mouth every 8 (eight) hours as needed. migraines    . PENICILLIN V POTASSIUM 250 MG PO TABS Oral Take 1 tablet (250 mg total) by mouth 4 (four) times daily. 20 tablet 0  . PREDNISONE 10 MG PO TABS  Take 6 tablets day one, 5 tablets day two, 4 tablets day three, 3 tablets day four, 2 tablets day five, then 1 tablet day six 21 tablet 0  . DAYQUIL/NYQUIL COLD/FLU RELIEF PO Oral Take 5 mLs by mouth 2 (two) times daily.      BP 121/44  Pulse 80  Temp(Src) 98.4 F (36.9  C) (Oral)  Resp 16  Wt 242 lb (109.77 kg)  SpO2 97%  Physical Exam 0815: Physical examination: Vital signs and O2 SAT: Reviewed; Constitutional: Well developed, Well nourished, Well hydrated, In no acute distress; Head and Face: Normocephalic, Atraumatic; Eyes: EOMI, PERRL, No scleral icterus; ENMT: Mouth and pharynx normal, Poor dentition, Widespread dental decay, Left TM normal, Right TM normal, Mucous membranes moist, +upper left 1st molar with extensive dental decay.  No gingival erythema, edema, fluctuance, or drainage.  No intra-oral edema or erythema.  No hoarse voice, no drooling, no stridor.  ; Neck: Supple, Full range of motion, No lymphadenopathy; Cardiovascular: Regular rate and rhythm, No murmur or gallop; Respiratory: Breath sounds clear & equal bilaterally,  No rales, rhonchi, wheezes, Normal respiratory effort/excursion; Chest: Nontender, Movement normal; Extremities: Pulses normal, No tenderness, No edema; Neuro: AA&Ox3, Major CN grossly intact. Speech clear, no facial droop. No gross focal motor or sensory deficits in extremities.; Skin: Color normal, No rash, No petechiae, Warm, Dry   ED Course  Procedures    MDM  MDM Reviewed: nursing note and vitals     I personally performed the services described in this documentation, which was scribed in my presence. The recorded information has been reviewed and considered. Clarann Helvey Allison Quar[place], DO 03/31/12 1819

## 2012-03-29 NOTE — ED Notes (Signed)
Toothache to left upper side x 3 days with sore throat and swelling.

## 2012-05-21 ENCOUNTER — Encounter (HOSPITAL_COMMUNITY): Payer: Self-pay | Admitting: Emergency Medicine

## 2012-05-21 ENCOUNTER — Emergency Department (HOSPITAL_COMMUNITY)
Admission: EM | Admit: 2012-05-21 | Discharge: 2012-05-21 | Disposition: A | Discharge status: Home / Self Care | Payer: Self-pay | Attending: Emergency Medicine | Admitting: Emergency Medicine

## 2012-05-21 DIAGNOSIS — F17200 Nicotine dependence, unspecified, uncomplicated: Secondary | ICD-10-CM | POA: Insufficient documentation

## 2012-05-21 DIAGNOSIS — M545 Low back pain, unspecified: Secondary | ICD-10-CM | POA: Insufficient documentation

## 2012-05-21 DIAGNOSIS — M79609 Pain in unspecified limb: Secondary | ICD-10-CM | POA: Insufficient documentation

## 2012-05-21 DIAGNOSIS — J45909 Unspecified asthma, uncomplicated: Secondary | ICD-10-CM | POA: Insufficient documentation

## 2012-05-21 DIAGNOSIS — M549 Dorsalgia, unspecified: Secondary | ICD-10-CM

## 2012-05-21 DIAGNOSIS — R209 Unspecified disturbances of skin sensation: Secondary | ICD-10-CM | POA: Insufficient documentation

## 2012-05-21 MED ORDER — HYDROMORPHONE HCL PF 2 MG/ML IJ SOLN
2.0000 mg | Freq: Once | INTRAMUSCULAR | Status: DC
  Administered 2012-05-21: 2 mg via INTRAVENOUS

## 2012-05-21 MED ORDER — HYDROMORPHONE HCL PF 1 MG/ML IJ SOLN: 2.0000 mg | Freq: Once | INTRAMUSCULAR | Status: DC

## 2012-05-21 MED ORDER — HYDROMORPHONE HCL PF 2 MG/ML IJ SOLN
INTRAMUSCULAR | Status: AC
  Administered 2012-05-21: 2 mg via INTRAVENOUS
  Filled 2012-05-21: qty 1

## 2012-05-21 MED ORDER — OXYCODONE-ACETAMINOPHEN 5-325 MG PO TABS: 1.0000 | ORAL_TABLET | ORAL | Status: AC | PRN

## 2012-05-21 NOTE — ED Notes (Signed)
pts husband arrived for transport to home, has questions for md, dr. Bebe Shaggy to bedside for exam.

## 2012-05-21 NOTE — ED Provider Notes (Signed)
History  This chart was scribed for Joya Gaskins, MD by Bennett Scrape. This patient was seen in room APA15/APA15 and the patient's care was started at 6:51AM.  CSN: [id number]  Arrival date & time 05/21/12  4098   First MD Initiated Contact with Patient 05/21/12 [phone]      Chief Complaint  Patient presents with  . Back Pain     Patient is a 41 y.o. female presenting with back pain. The history is provided by the patient. No language interpreter was used.  Back Pain  This is a chronic problem. The problem occurs constantly. The problem has been gradually worsening. The pain is associated with lifting heavy objects. The pain is present in the lumbar spine. The quality of the pain is described as cramping. The pain radiates to the left thigh, left knee, right thigh, left foot, right knee and right foot. Associated symptoms include numbness. Pertinent negatives include no fever and no weakness.    Danielle Bonilla is a 41 y.o. female who presents to the Emergency Department complaining of one week of gradual onset, gradually worsening, constant lower back pain described as a spasm with associated numbness described as a tingling sensation running down both legs. 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 She denies any recent injury. She rates her pain a 10 out of 10 currently but is still able to ambulate. She reports that the pain is worse with all positions and walking. She reports using "Back Max" and ibuprofen with no improvement in symptoms. She has seen a chiropractor and neurologist with no relief. She reports having a negative MRI several years. She denies a h/o back surgery. She denies urinary symptoms/incontinence, chest pain, SOB, abdominal pain, fevers, rash and weakness as associated symptoms. She has a h/o asthma. She is a current everyday smoker but denies alcohol use.  No PCP.  Past Medical History  Diagnosis Date  . Bronchitis   . Asthma      Past Surgical History  Procedure Date  . Abdominal hysterectomy   . Tumor excision     Family History  Problem Relation Age of Onset  . Cancer Father   . Diabetes Father     History  Substance Use Topics  . Smoking status: Current Everyday Smoker    Types: Cigarettes  . Smokeless tobacco: Not on file  . Alcohol Use: No    OB History    Grav Para Term Preterm Abortions TAB SAB Ect Mult Living   4 4 4              Review of Systems  Constitutional: Negative for fever.  Musculoskeletal: Positive for back pain. Negative for joint swelling.  Neurological: Positive for numbness. Negative for weakness.  All other systems reviewed and are negative.    Allergies  Latex and Pineapple  Home Medications   Current Outpatient Rx  Name Route Sig Dispense Refill  . ASPIRIN-ACETAMINOPHEN-CAFFEINE 250-250-65 MG PO TABS Oral Take 2 tablets by mouth every 6 (six) hours as needed.    . AZITHROMYCIN 250 MG PO TABS  Take two tablets on day one, then one tab qd days 2-5 Take first 2 tablets together, then 1 every day until finished. 6 tablet 0  . IBUPROFEN 200 MG PO TABS Oral Take 400 mg by mouth every 8 (eight) hours as needed. migraines    . PREDNISONE 10 MG PO TABS  Take 6 tablets day one, 5 tablets day two,  4 tablets day three, 3 tablets day four, 2 tablets day five, then 1 tablet day six 21 tablet 0  . DAYQUIL/NYQUIL COLD/FLU RELIEF PO Oral Take 5 mLs by mouth 2 (two) times daily.      Triage Vitals: BP 110/75  Pulse 80  Temp 98.5 F (36.9 C) (Oral)  Resp 15  Ht 5\' 7"  (1.702 m)  Wt 245 lb (111.131 kg)  BMI 38.37 kg/m2  SpO2 100%  Physical Exam  Nursing note and vitals reviewed.  CONSTITUTIONAL: Well developed/well nourished HEAD AND FACE: Normocephalic/atraumatic EYES: EOMI/PERRL ENMT: Mucous membranes moist NECK: supple no meningeal signs SPINE:entire spine nontender, paraspinal lumbar tenderness, No bruising/crepitance/stepoffs noted to spine CV: S1/S2 noted, no  murmurs/rubs/gallops noted LUNGS: Lungs are clear to auscultation bilaterally, no apparent distress ABDOMEN: soft, nontender, no rebound or guarding GU:no cva tenderness NEURO: Pt is awake/alert, moves all extremitiesx4, equal distal motor: hip flexion/knee flexion/extension, ankle dorsi/plantar flexion, great toe extension intact bilaterally, no clonus bilaterally, plantar reflex appropriate, no apparent sensory deficit in any dermatome.  Equal patellar/achilles reflex noted.  Pt is able to ambulate. EXTREMITIES: pulses normal, full ROM SKIN: warm, color normal PSYCH: no abnormalities of mood noted   ED Course  Procedures   DIAGNOSTIC STUDIES: Oxygen Saturation is 100% on room air, normal by my interpretation.    COORDINATION OF CARE: 7:04AM-Discussed discharge plan of Percocet and referral to a spine specialist. Advised pt to follow up and possibly have a repeat MRI. Pt drove herself but stated that she could get a ride, sowill give a shot of pain medication prior to discharge.     MDM  Nursing notes including past medical history and social history reviewed and considered in documentation    I personally performed the services described in this documentation, which was scribed in my presence. The recorded information has been reviewed and considered.           Joya Gaskins, MD 05/21/12 [phone]

## 2012-05-21 NOTE — ED Notes (Signed)
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 States that over the past week the pain has been worse and that she has been having numbness and tingling running down her legs bilaterally. Denies any recent injury. Pain 10/10

## 2012-05-29 ENCOUNTER — Encounter (HOSPITAL_COMMUNITY): Payer: Self-pay

## 2012-05-29 ENCOUNTER — Emergency Department (HOSPITAL_COMMUNITY)
Admission: EM | Admit: 2012-05-29 | Discharge: 2012-05-29 | Disposition: A | Discharge status: Home / Self Care | Payer: Self-pay | Attending: Emergency Medicine | Admitting: Emergency Medicine

## 2012-05-29 DIAGNOSIS — F17200 Nicotine dependence, unspecified, uncomplicated: Secondary | ICD-10-CM | POA: Insufficient documentation

## 2012-05-29 DIAGNOSIS — L089 Local infection of the skin and subcutaneous tissue, unspecified: Secondary | ICD-10-CM | POA: Insufficient documentation

## 2012-05-29 MED ORDER — CEPHALEXIN 500 MG PO CAPS: 500.0000 mg | ORAL_CAPSULE | Freq: Four times a day (QID) | ORAL | Status: AC

## 2012-05-29 MED ORDER — HYDROCODONE-ACETAMINOPHEN 5-325 MG PO TABS
1.0000 | ORAL_TABLET | Freq: Once | ORAL | Status: AC
  Administered 2012-05-29: 1 via ORAL
  Filled 2012-05-29: qty 1

## 2012-05-29 MED ORDER — CEPHALEXIN 500 MG PO CAPS
500.0000 mg | ORAL_CAPSULE | Freq: Once | ORAL | Status: AC
  Administered 2012-05-29: 500 mg via ORAL
  Filled 2012-05-29: qty 1

## 2012-05-29 MED ORDER — HYDROCODONE-ACETAMINOPHEN 5-325 MG PO TABS: ORAL_TABLET | ORAL | Status: AC

## 2012-05-29 MED ORDER — CEPHALEXIN 250 MG PO CAPS: 250.0000 mg | ORAL_CAPSULE | Freq: Four times a day (QID) | ORAL | Status: DC

## 2012-05-29 NOTE — ED Notes (Signed)
Right foot great toe pain since yesterday, mild swelling noted. Denies any known injury

## 2012-05-29 NOTE — ED Notes (Signed)
Pt presents with rt great toe pain x 24 hrs.  Minimal swelling noted. Pt states pain has increased throughout the day while standing on feet all day.

## 2012-05-29 NOTE — ED Provider Notes (Signed)
History     CSN: [id number]  Arrival date & time 05/29/12  1739   First MD Initiated Contact with Patient 05/29/12 1752      Chief Complaint  Patient presents with  . Toe Pain    (Consider location/radiation/quality/duration/timing/severity/associated sxs/prior treatment) HPI Comments: Patient c/o pain to her right great toe since yesterday.  States that her significant other noticed a blister on her toe and "popped" it with a needle.  She c/o pain that's worse with weight bearing and movement of the toe.  She denies numbness, swelling of the foot, redness or hx of diabetes.   Patient is a 41 y.o. female presenting with toe pain. The history is provided by the patient.  Toe Pain This is a new problem. The current episode started yesterday. The problem occurs constantly. The problem has been unchanged. Associated symptoms include arthralgias. Pertinent negatives include no chills, fever, joint swelling, numbness, rash, swollen glands, vomiting or weakness. The symptoms are aggravated by standing and walking. She has tried nothing for the symptoms. The treatment provided no relief.    Past Medical History  Diagnosis Date  . Bronchitis   . Asthma     Past Surgical History  Procedure Date  . Abdominal hysterectomy   . Tumor excision     Family History  Problem Relation Age of Onset  . Cancer Father   . Diabetes Father     History  Substance Use Topics  . Smoking status: Current Everyday Smoker    Types: Cigarettes  . Smokeless tobacco: Not on file  . Alcohol Use: No    OB History    Grav Para Term Preterm Abortions TAB SAB Ect Mult Living   4 4 4              Review of Systems  Constitutional: Negative for fever and chills.  Gastrointestinal: Negative for vomiting.  Genitourinary: Negative for dysuria and difficulty urinating.  Musculoskeletal: Positive for arthralgias. Negative for joint swelling.  Skin: Positive for wound. Negative for color change and rash.    Neurological: Negative for weakness and numbness.  All other systems reviewed and are negative.    Allergies  Latex and Pineapple  Home Medications   Current Outpatient Rx  Name Route Sig Dispense Refill  . ASPIRIN-ACETAMINOPHEN-CAFFEINE 250-250-65 MG PO TABS Oral Take 2 tablets by mouth every 6 (six) hours as needed.     . CEPHALEXIN 500 MG PO CAPS Oral Take 1 capsule (500 mg total) by mouth 4 (four) times daily. 28 capsule 0  . HYDROCODONE-ACETAMINOPHEN 5-325 MG PO TABS  Take one-two tabs po q 4-6 hrs prn pain 12 tablet 0  . IBUPROFEN 200 MG PO TABS Oral Take 400 mg by mouth every 8 (eight) hours as needed. migraines    . OXYCODONE-ACETAMINOPHEN 5-325 MG PO TABS Oral Take 1 tablet by mouth every 4 (four) hours as needed for pain. 15 tablet 0  . DAYQUIL/NYQUIL COLD/FLU RELIEF PO Oral Take 5 mLs by mouth 2 (two) times daily.      BP 114/63  Pulse 80  Temp 98.2 F (36.8 C) (Oral)  Resp 18  Ht 5\' 10"  (1.778 m)  Wt 246 lb (111.585 kg)  BMI 35.30 kg/m2  SpO2 99%  Physical Exam  Nursing note and vitals reviewed. Constitutional: She is oriented to person, place, and time. She appears well-developed and well-nourished. No distress.  HENT:  Head: Normocephalic and atraumatic.  Cardiovascular: Normal rate, regular rhythm, normal heart sounds and intact distal  pulses.   Pulmonary/Chest: Effort normal and breath sounds normal.  Musculoskeletal: She exhibits tenderness. She exhibits no edema.       Right foot: She exhibits tenderness. She exhibits normal range of motion, no swelling, normal capillary refill, no crepitus, no deformity and no laceration.       Feet:       Small , draining blister between the right great toe and second toe.  ROM is preserved.  DP pulse is brisk, sensation intact.  No erythema or STS.    Neurological: She is alert and oriented to person, place, and time. She exhibits normal muscle tone. Coordination normal.  Skin: Skin is warm and dry.    ED Course   Procedures (including critical care time)  Labs Reviewed - No data to display   1. Skin infection       MDM     Patient agrees to close follow-up or return here if sx's worsen.    Prescribed: Keflex  norco # [id number]    Bliss Behnke L. [place], [place] 05/30/12 0008

## 2012-05-29 NOTE — Discharge Instructions (Signed)
Skin Infections A skin infection usually develops as a result of disruption of the skin barrier.  CAUSES  A skin infection might occur following:  Trauma or an injury to the skin such as a cut or insect sting.   Inflammation (as in eczema).   Breaks in the skin between the toes (as in athlete's foot).   Swelling (edema).  SYMPTOMS  The legs are the most common site affected. Usually there is:  Redness.   Swelling.   Pain.   There may be red streaks in the area of the infection.  TREATMENT   Minor skin infections may be treated with topical antibiotics, but if the skin infection is severe, hospital care and intravenous (IV) antibiotic treatment may be needed.   Most often skin infections can be treated with oral antibiotic medicine as well as proper rest and elevation of the affected area until the infection improves.   If you are prescribed oral antibiotics, it is important to take them as directed and to take all the pills even if you feel better before you have finished all of the medicine.   You may apply warm compresses to the area for 20-30 minutes 4 times daily.  You might need a tetanus shot now if:  You have no idea when you had the last one.   You have never had a tetanus shot before.   Your wound had dirt in it.  If you need a tetanus shot and you decide not to get one, there is a rare chance of getting tetanus. Sickness from tetanus can be serious. If you get a tetanus shot, your arm may swell and become red and warm at the shot site. This is common and not a problem. SEEK MEDICAL CARE IF:  The pain and swelling from your infection do not improve within 2 days.  SEEK IMMEDIATE MEDICAL CARE IF:  You develop a fever, chills, or other serious problems.  Document Released: 12/11/2004 Document Revised: 10/23/2011 Document Reviewed: 10/23/2008 [employer] Patient Information 2012 [employer]. 

## 2012-05-30 NOTE — ED Provider Notes (Signed)
PA needs to finish chart   Ward Givens, MD 05/30/12 [phone]

## 2012-08-02 ENCOUNTER — Emergency Department (HOSPITAL_COMMUNITY)
Admission: EM | Admit: 2012-08-02 | Discharge: 2012-08-02 | Disposition: A | Discharge status: Home / Self Care | Payer: Self-pay | Attending: Emergency Medicine | Admitting: Emergency Medicine

## 2012-08-02 ENCOUNTER — Encounter (HOSPITAL_COMMUNITY): Payer: Self-pay | Admitting: *Deleted

## 2012-08-02 DIAGNOSIS — M549 Dorsalgia, unspecified: Secondary | ICD-10-CM | POA: Insufficient documentation

## 2012-08-02 NOTE — ED Notes (Signed)
Low back pain for 3 years, worse last 3 days.  Alert,

## 2012-10-03 ENCOUNTER — Emergency Department (HOSPITAL_COMMUNITY)
Admission: EM | Admit: 2012-10-03 | Discharge: 2012-10-03 | Disposition: A | Discharge status: Home / Self Care | Payer: Self-pay | Attending: Emergency Medicine | Admitting: Emergency Medicine

## 2012-10-03 ENCOUNTER — Encounter (HOSPITAL_COMMUNITY): Payer: Self-pay | Admitting: Emergency Medicine

## 2012-10-03 DIAGNOSIS — J45909 Unspecified asthma, uncomplicated: Secondary | ICD-10-CM | POA: Insufficient documentation

## 2012-10-03 DIAGNOSIS — Z79899 Other long term (current) drug therapy: Secondary | ICD-10-CM | POA: Insufficient documentation

## 2012-10-03 DIAGNOSIS — F17200 Nicotine dependence, unspecified, uncomplicated: Secondary | ICD-10-CM | POA: Insufficient documentation

## 2012-10-03 DIAGNOSIS — L723 Sebaceous cyst: Secondary | ICD-10-CM | POA: Insufficient documentation

## 2012-10-03 DIAGNOSIS — Z9889 Other specified postprocedural states: Secondary | ICD-10-CM | POA: Insufficient documentation

## 2012-10-03 MED ORDER — LIDOCAINE HCL (PF) 1 % IJ SOLN
5.0000 mL | Freq: Once | INTRAMUSCULAR | Status: AC
  Administered 2012-10-03: 5 mL
  Filled 2012-10-03: qty 5

## 2012-10-03 MED ORDER — HYDROCODONE-ACETAMINOPHEN 5-325 MG PO TABS: 1.0000 | ORAL_TABLET | ORAL | Status: AC | PRN

## 2012-10-03 MED ORDER — HYDROCODONE-ACETAMINOPHEN 5-325 MG PO TABS
1.0000 | ORAL_TABLET | Freq: Once | ORAL | Status: AC
  Administered 2012-10-03: 1 via ORAL
  Filled 2012-10-03: qty 1

## 2012-10-03 NOTE — ED Notes (Signed)
Warm blanket given

## 2012-10-03 NOTE — ED Notes (Signed)
Pt reports "knot to low back x 1 year", pain worse and area more swollen today. Pa idol to see pt.  Suture cart to bedside.

## 2012-10-03 NOTE — ED Notes (Signed)
Pt c/o knot to back x one year, but has started getting worse.

## 2012-10-04 NOTE — ED Provider Notes (Signed)
History     CSN: [id number]  Arrival date & time 10/03/12  1703   First MD Initiated Contact with Patient 10/03/12 1717      Chief Complaint  Patient presents with  . Back Pain    (Consider location/radiation/quality/duration/timing/severity/associated sxs/prior treatment) HPI Comments: Danielle Bonilla presents with a "knot" on her mid upper back which has been present for at least the past year.  Over the past several weeks it has become larger and is now tender not only with palpation or when leaning against a chair,  But also at rest.  There has been no drainage from the site.  She has had no fevers and she has noted no redness or rash at this location.  The history is provided by the patient.    Past Medical History  Diagnosis Date  . Bronchitis   . Asthma     Past Surgical History  Procedure Date  . Abdominal hysterectomy   . Tumor excision     Family History  Problem Relation Age of Onset  . Cancer Father   . Diabetes Father     History  Substance Use Topics  . Smoking status: Current Every Day Smoker    Types: Cigarettes  . Smokeless tobacco: Not on file  . Alcohol Use: No    OB History    Grav Para Term Preterm Abortions TAB SAB Ect Mult Living   4 4 4              Review of Systems  Constitutional: Negative for fever and chills.  HENT: Negative for facial swelling.   Respiratory: Negative for shortness of breath and wheezing.   Musculoskeletal: Positive for back pain.  Neurological: Negative for numbness.    Allergies  Latex and Pineapple  Home Medications   Current Outpatient Rx  Name  Route  Sig  Dispense  Refill  . ASPIRIN-ACETAMINOPHEN-CAFFEINE 250-250-65 MG PO TABS   Oral   Take 2 tablets by mouth every 6 (six) hours as needed.          Marland Kitchen HYDROCODONE-ACETAMINOPHEN 5-325 MG PO TABS   Oral   Take 1 tablet by mouth every 4 (four) hours as needed for pain.   15 tablet   0   . IBUPROFEN 200 MG PO TABS   Oral   Take 400 mg by  mouth every 8 (eight) hours as needed. migraines         . DAYQUIL/NYQUIL COLD/FLU RELIEF PO   Oral   Take 5 mLs by mouth 2 (two) times daily.           BP 118/68  Pulse 64  Temp 98 F (36.7 C) (Oral)  Resp 16  Ht 5\' 7"  (1.702 m)  Wt 235 lb (106.595 kg)  BMI 36.81 kg/m2  SpO2 100%  Physical Exam  Constitutional: She appears well-developed and well-nourished. No distress.  HENT:  Head: Normocephalic.  Neck: Neck supple.  Cardiovascular: Normal rate.   Pulmonary/Chest: Effort normal. She has no wheezes.  Musculoskeletal: Normal range of motion. She exhibits no edema.  Skin: Skin is warm and dry. No erythema.       4 x 4 cm slightly raised fluctuant mass midthoracic back with central large sebum blocked punctum consistent with sebaceous cyst.   Tender, but no erythema or red streaking.    ED Course  Procedures (including critical care time)  Labs Reviewed - No data to display No results found.    INCISION AND DRAINAGE Performed  by: Burgess Amor Consent: Verbal consent obtained. Risks and benefits: risks, benefits and alternatives were discussed Type: sebaceous cyst  Body area: back  Anesthesia: local infiltration  Local anesthetic: lidocaine 1% without epinephrine  Anesthetic total: 5 ml  Complexity: complex Blunt dissection to break up loculations  Drainage: No drainage obtained despite cutting to the depth of 11 blade reach.  Drainage amount: none  Packing material: no packing.  Sterile strip was applied over incision site  Patient tolerance: Patient tolerated the procedure well with no immediate complications.     1. Sebaceous cyst       MDM  Pt referred to Dr Leticia Penna for further exploration of this suspected sebaceous cyst.  This will require larger incision than appropriate in ed setting. Hydrocodone prescribed.  Pt to call in am for appt.        Burgess Amor, [place] 10/04/12 [phone]

## 2012-10-04 NOTE — ED Provider Notes (Signed)
Medical screening examination/treatment/procedure(s) were performed by non-physician practitioner and as supervising physician I was immediately available for consultation/collaboration.   Jazmin Ley L Yun Gutierrez, MD 10/04/12 1554 

## 2012-10-25 ENCOUNTER — Emergency Department (HOSPITAL_COMMUNITY)
Admission: EM | Admit: 2012-10-25 | Discharge: 2012-10-25 | Disposition: A | Discharge status: Home / Self Care | Payer: Self-pay | Attending: Emergency Medicine | Admitting: Emergency Medicine

## 2012-10-25 ENCOUNTER — Encounter (HOSPITAL_COMMUNITY): Payer: Self-pay | Admitting: Emergency Medicine

## 2012-10-25 ENCOUNTER — Emergency Department (HOSPITAL_COMMUNITY): Payer: Self-pay

## 2012-10-25 DIAGNOSIS — R197 Diarrhea, unspecified: Secondary | ICD-10-CM | POA: Insufficient documentation

## 2012-10-25 DIAGNOSIS — F17200 Nicotine dependence, unspecified, uncomplicated: Secondary | ICD-10-CM | POA: Insufficient documentation

## 2012-10-25 DIAGNOSIS — K5289 Other specified noninfective gastroenteritis and colitis: Secondary | ICD-10-CM | POA: Insufficient documentation

## 2012-10-25 DIAGNOSIS — J45909 Unspecified asthma, uncomplicated: Secondary | ICD-10-CM | POA: Insufficient documentation

## 2012-10-25 DIAGNOSIS — Z79899 Other long term (current) drug therapy: Secondary | ICD-10-CM | POA: Insufficient documentation

## 2012-10-25 DIAGNOSIS — R112 Nausea with vomiting, unspecified: Secondary | ICD-10-CM | POA: Insufficient documentation

## 2012-10-25 DIAGNOSIS — Z7982 Long term (current) use of aspirin: Secondary | ICD-10-CM | POA: Insufficient documentation

## 2012-10-25 DIAGNOSIS — R1033 Periumbilical pain: Secondary | ICD-10-CM | POA: Insufficient documentation

## 2012-10-25 DIAGNOSIS — R109 Unspecified abdominal pain: Secondary | ICD-10-CM

## 2012-10-25 DIAGNOSIS — K529 Noninfective gastroenteritis and colitis, unspecified: Secondary | ICD-10-CM

## 2012-10-25 DIAGNOSIS — R5383 Other fatigue: Secondary | ICD-10-CM | POA: Insufficient documentation

## 2012-10-25 DIAGNOSIS — R51 Headache: Secondary | ICD-10-CM | POA: Insufficient documentation

## 2012-10-25 DIAGNOSIS — R5381 Other malaise: Secondary | ICD-10-CM | POA: Insufficient documentation

## 2012-10-25 LAB — CBC WITH DIFFERENTIAL/PLATELET
Basophils Absolute: 0 10*3/uL (ref 0.0–0.1)
Basophils Relative: 0 % (ref 0–1)
Eosinophils Absolute: 0.1 10*3/uL (ref 0.0–0.7)
Eosinophils Relative: 3 % (ref 0–5)
HCT: 38 % (ref 36.0–46.0)
MCHC: 32.9 g/dL (ref 30.0–36.0)
MCV: 84.8 fL (ref 78.0–100.0)
Monocytes Absolute: 0.2 10*3/uL (ref 0.1–1.0)
RDW: 14.7 % (ref 11.5–15.5)

## 2012-10-25 LAB — COMPREHENSIVE METABOLIC PANEL
AST: 15 U/L (ref 0–37)
Albumin: 3.6 g/dL (ref 3.5–5.2)
Calcium: 9 mg/dL (ref 8.4–10.5)
Creatinine, Ser: 0.79 mg/dL (ref 0.50–1.10)
GFR calc non Af Amer: >90 mL/min (ref >90)
Total Protein: 7.8 g/dL (ref 6.0–8.3)

## 2012-10-25 MED ORDER — ONDANSETRON HCL 4 MG/2ML IJ SOLN
4.0000 mg | Freq: Once | INTRAMUSCULAR | Status: AC
  Administered 2012-10-25: 4 mg via INTRAVENOUS
  Filled 2012-10-25: qty 2

## 2012-10-25 MED ORDER — HYDROMORPHONE HCL PF 1 MG/ML IJ SOLN
INTRAMUSCULAR | Status: AC
  Filled 2012-10-25: qty 1

## 2012-10-25 MED ORDER — SODIUM CHLORIDE 0.9 % IV SOLN: INTRAVENOUS | Status: DC

## 2012-10-25 MED ORDER — HYDROMORPHONE HCL PF 1 MG/ML IJ SOLN
1.0000 mg | Freq: Once | INTRAMUSCULAR | Status: AC
  Administered 2012-10-25: 1 mg via INTRAVENOUS

## 2012-10-25 MED ORDER — SODIUM CHLORIDE 0.9 % IV BOLUS (SEPSIS)
250.0000 mL | Freq: Once | INTRAVENOUS | Status: AC
  Administered 2012-10-25: 250 mL via INTRAVENOUS

## 2012-10-25 MED ORDER — PROMETHAZINE HCL 25 MG PO TABS: 25.0000 mg | ORAL_TABLET | Freq: Four times a day (QID) | ORAL | Status: DC | PRN

## 2012-10-25 MED ORDER — HYDROMORPHONE HCL PF 1 MG/ML IJ SOLN
1.0000 mg | Freq: Once | INTRAMUSCULAR | Status: AC
  Administered 2012-10-25: 1 mg via INTRAVENOUS
  Filled 2012-10-25: qty 1

## 2012-10-25 MED ORDER — IOHEXOL 300 MG/ML  SOLN
100.0000 mL | Freq: Once | INTRAMUSCULAR | Status: AC | PRN
  Administered 2012-10-25: 100 mL via INTRAVENOUS

## 2012-10-25 NOTE — ED Provider Notes (Addendum)
History     CSN: [id number]  Arrival date & time 10/25/12  4098   First MD Initiated Contact with Patient 10/25/12 [phone]      Chief Complaint  Patient presents with  . Emesis  . Diarrhea    (Consider location/radiation/quality/duration/timing/severity/associated sxs/prior treatment) Patient is a 41 y.o. female presenting with vomiting and diarrhea. The history is provided by the patient and the spouse.  Emesis  Associated symptoms include abdominal pain, diarrhea and headaches. Pertinent negatives include no fever.  Diarrhea The primary symptoms include fatigue, abdominal pain, nausea, vomiting and diarrhea. Primary symptoms do not include fever, dysuria or rash.  The illness does not include back pain.  patient is a 41 year old female that was the feeling well until Wednesday when she developed a headache the headache has persisted was not severe. And then last evening at 6:30 PM started with periumbilical abdominal pain that became constant started with diarrhea at 9:00 in the evening no blood in that his had multiple episodes of loose bowel movements since then and has vomited x2 no blood in the vomit either. Last vomited at 2 in the morning. Potential sick contacts at work. The patient does not have a primary care Dr. Has not the had similar symptoms in the past has had abdominal surgeries abdominal hysterectomy. History of asthma bronchitis.  The belly pain is described as sharp ache constant nonradiating. Rated 10 out of 10.  Past Medical History  Diagnosis Date  . Bronchitis   . Asthma     Past Surgical History  Procedure Date  . Abdominal hysterectomy   . Tumor excision   . Abdominal hysterectomy     Family History  Problem Relation Age of Onset  . Cancer Father   . Diabetes Father     History  Substance Use Topics  . Smoking status: Current Every Day Smoker    Types: Cigarettes  . Smokeless tobacco: Not on file  . Alcohol Use: No    OB History    Grav  Para Term Preterm Abortions TAB SAB Ect Mult Living   4 4 4              Review of Systems  Constitutional: Positive for fatigue. Negative for fever.  HENT: Negative for congestion and neck pain.   Cardiovascular: Negative for chest pain.  Gastrointestinal: Positive for nausea, vomiting, abdominal pain and diarrhea. Negative for blood in stool.  Genitourinary: Negative for dysuria.  Musculoskeletal: Negative for back pain.  Skin: Negative for rash.  Neurological: Positive for headaches.    Allergies  Latex and Pineapple  Home Medications   Current Outpatient Rx  Name  Route  Sig  Dispense  Refill  . ASPIRIN-ACETAMINOPHEN-CAFFEINE 250-250-65 MG PO TABS   Oral   Take 2 tablets by mouth every 6 (six) hours as needed.          . IBUPROFEN 200 MG PO TABS   Oral   Take 400 mg by mouth every 8 (eight) hours as needed. migraines         . DAYQUIL/NYQUIL COLD/FLU RELIEF PO   Oral   Take 5 mLs by mouth 2 (two) times daily.           BP 97/57  Pulse 91  Temp 99.1 F (37.3 C) (Oral)  Resp 18  Ht 5\' 7"  (1.702 m)  Wt 247 lb (112.038 kg)  BMI 38.69 kg/m2  SpO2 98%  Physical Exam  Nursing note and vitals reviewed. Constitutional: She is oriented  to person, place, and time. She appears well-developed and well-nourished. No distress.  HENT:  Head: Normocephalic and atraumatic.  Mouth/Throat: Oropharynx is clear and moist.  Eyes: Conjunctivae normal and EOM are normal. Pupils are equal, round, and reactive to light.  Neck: Normal range of motion. Neck supple.  Cardiovascular: Normal rate, regular rhythm and normal heart sounds.   No murmur heard. Pulmonary/Chest: Effort normal and breath sounds normal. No respiratory distress. She has no rales.  Abdominal: Soft. Bowel sounds are normal. There is tenderness.       Diffusely.  Musculoskeletal: Normal range of motion. She exhibits no edema.  Neurological: She is alert and oriented to person, place, and time. No cranial  nerve deficit. She exhibits normal muscle tone. Coordination normal.  Skin: Skin is warm. No rash noted.    ED Course  Procedures (including critical care time)   Labs Reviewed  CBC WITH DIFFERENTIAL  COMPREHENSIVE METABOLIC PANEL  LIPASE, BLOOD  URINALYSIS, ROUTINE W REFLEX MICROSCOPIC   No results found. Results for orders placed during the hospital encounter of 10/25/12  CBC WITH DIFFERENTIAL      Component Value Range   WBC 4.9  4.0 - 10.5 K/uL   RBC 4.48  3.87 - 5.11 MIL/uL   Hemoglobin 12.5  12.0 - 15.0 g/dL   HCT 16.1  09.6 - 04.5 %   MCV 84.8  78.0 - 100.0 fL   MCH 27.9  26.0 - 34.0 pg   MCHC 32.9  30.0 - 36.0 g/dL   RDW 40.9  81.1 - 91.4 %   Platelets 316  150 - 400 K/uL   Neutrophils Relative 75  43 - 77 %   Neutro Abs 3.7  1.7 - 7.7 K/uL   Lymphocytes Relative 18  12 - 46 %   Lymphs Abs 0.9  0.7 - 4.0 K/uL   Monocytes Relative 5  3 - 12 %   Monocytes Absolute 0.2  0.1 - 1.0 K/uL   Eosinophils Relative 3  0 - 5 %   Eosinophils Absolute 0.1  0.0 - 0.7 K/uL   Basophils Relative 0  0 - 1 %   Basophils Absolute 0.0  0.0 - 0.1 K/uL  COMPREHENSIVE METABOLIC PANEL      Component Value Range   Sodium 138  135 - 145 mEq/L   Potassium 4.1  3.5 - 5.1 mEq/L   Chloride 106  96 - 112 mEq/L   CO2 20  19 - 32 mEq/L   Glucose, Bld 96  70 - 99 mg/dL   BUN 11  6 - 23 mg/dL   Creatinine, Ser 7.82  0.50 - 1.10 mg/dL   Calcium 9.0  8.4 - 95.6 mg/dL   Total Protein 7.8  6.0 - 8.3 g/dL   Albumin 3.6  3.5 - 5.2 g/dL   AST 15  0 - 37 U/L   ALT 10  0 - 35 U/L   Alkaline Phosphatase 43  39 - 117 U/L   Total Bilirubin 0.4  0.3 - 1.2 mg/dL   GFR calc non Af Amer >90  >90 mL/min   GFR calc Af Amer >90  >90 mL/min  LIPASE, BLOOD      Component Value Range   Lipase 31  11 - 59 U/L     1. Abdominal pain   2. Gastroenteritis       MDM  Patient's symptoms sound as if it may just be a viral gastroenteritis but due to the significant belly pain  we'll get a CT scan. Patient has  had abdominal surgery in the past CT scan of the used to rule out bowel obstruction.patient improves some with pain medicine and antinausea medicine and IV fluids in the ED. Outpatient CT scan is still pending she's been turned over to the daytime doctor he will followup the CT scan and that will probably dictate the disposition.        Shelda Jakes, MD 10/25/12 0710  Addendum:  Results for orders placed during the hospital encounter of 10/25/12  CBC WITH DIFFERENTIAL      Component Value Range   WBC 4.9  4.0 - 10.5 K/uL   RBC 4.48  3.87 - 5.11 MIL/uL   Hemoglobin 12.5  12.0 - 15.0 g/dL   HCT 96.0  45.4 - 09.8 %   MCV 84.8  78.0 - 100.0 fL   MCH 27.9  26.0 - 34.0 pg   MCHC 32.9  30.0 - 36.0 g/dL   RDW 11.9  14.7 - 82.9 %   Platelets 316  150 - 400 K/uL   Neutrophils Relative 75  43 - 77 %   Neutro Abs 3.7  1.7 - 7.7 K/uL   Lymphocytes Relative 18  12 - 46 %   Lymphs Abs 0.9  0.7 - 4.0 K/uL   Monocytes Relative 5  3 - 12 %   Monocytes Absolute 0.2  0.1 - 1.0 K/uL   Eosinophils Relative 3  0 - 5 %   Eosinophils Absolute 0.1  0.0 - 0.7 K/uL   Basophils Relative 0  0 - 1 %   Basophils Absolute 0.0  0.0 - 0.1 K/uL  COMPREHENSIVE METABOLIC PANEL      Component Value Range   Sodium 138  135 - 145 mEq/L   Potassium 4.1  3.5 - 5.1 mEq/L   Chloride 106  96 - 112 mEq/L   CO2 20  19 - 32 mEq/L   Glucose, Bld 96  70 - 99 mg/dL   BUN 11  6 - 23 mg/dL   Creatinine, Ser 5.62  0.50 - 1.10 mg/dL   Calcium 9.0  8.4 - 13.0 mg/dL   Total Protein 7.8  6.0 - 8.3 g/dL   Albumin 3.6  3.5 - 5.2 g/dL   AST 15  0 - 37 U/L   ALT 10  0 - 35 U/L   Alkaline Phosphatase 43  39 - 117 U/L   Total Bilirubin 0.4  0.3 - 1.2 mg/dL   GFR calc non Af Amer >90  >90 mL/min   GFR calc Af Amer >90  >90 mL/min  LIPASE, BLOOD      Component Value Range   Lipase 31  11 - 59 U/L   Ct Abdomen Pelvis W Contrast  10/25/2012  *RADIOLOGY REPORT*  Clinical Data: Abdominal pain.  Vomiting.  Diarrhea.   Fatigue.  CT ABDOMEN AND PELVIS WITH CONTRAST  Technique:  Multidetector CT imaging of the abdomen and pelvis was performed following the standard protocol during bolus administration of intravenous contrast.  Contrast: OMNIPAQUE IOHEXOL 300 MG/ML  SOLN  Comparison: None.  Findings: The abdominal parenchymal organs are normal in appearance, except for a tiny sub-centimeter cyst in the inferior right hepatic lobe which is stable.  Gallbladder is unremarkable. No evidence of hydronephrosis.  No soft tissue masses or lymphadenopathy identified within the abdomen or pelvis.  Prior hysterectomy noted.  No evidence of inflammatory process or abnormal fluid collections. No evidence of bowel wall thickening,  dilatation, or hernia.  IMPRESSION: Negative.  No acute findings or other significant abnormality identified.   Original Report Authenticated By: Myles Rosenthal, M.D.       Shelda Jakes, MD 10/26/12 1057

## 2012-10-25 NOTE — ED Notes (Signed)
States that she started having vomiting and diarrhea last night, states she started having abdominal pain "all over."

## 2012-10-25 NOTE — ED Provider Notes (Addendum)
Care assumed from Dr. Deretha Emory at shift change.  The patient presents with n/v/d (all non-bloody) since yesterday evening.  She had labs which were all unremarkable and ct scan which was negative as well.  She has been given repeat doses of pain medications and fluids and is feeling somewhat better.  She has no urinary symptoms and I this is definitely GI in nature.  She urinated without giving a specimen and I believe we can forego the UA.  She will be discharged to home with anti-emetics, follow up prn.  Geoffery Lyons, MD 10/25/12 1610  Geoffery Lyons, MD 10/25/12 [phone]

## 2012-11-21 ENCOUNTER — Encounter (HOSPITAL_COMMUNITY): Payer: Self-pay | Admitting: *Deleted

## 2012-11-21 ENCOUNTER — Emergency Department (HOSPITAL_COMMUNITY)
Admission: EM | Admit: 2012-11-21 | Discharge: 2012-11-21 | Disposition: A | Discharge status: Home / Self Care | Payer: Self-pay | Attending: Emergency Medicine | Admitting: Emergency Medicine

## 2012-11-21 DIAGNOSIS — Z8709 Personal history of other diseases of the respiratory system: Secondary | ICD-10-CM | POA: Insufficient documentation

## 2012-11-21 DIAGNOSIS — F17200 Nicotine dependence, unspecified, uncomplicated: Secondary | ICD-10-CM | POA: Insufficient documentation

## 2012-11-21 DIAGNOSIS — Z7982 Long term (current) use of aspirin: Secondary | ICD-10-CM | POA: Insufficient documentation

## 2012-11-21 DIAGNOSIS — B353 Tinea pedis: Secondary | ICD-10-CM | POA: Insufficient documentation

## 2012-11-21 DIAGNOSIS — R21 Rash and other nonspecific skin eruption: Secondary | ICD-10-CM | POA: Insufficient documentation

## 2012-11-21 DIAGNOSIS — Z791 Long term (current) use of non-steroidal anti-inflammatories (NSAID): Secondary | ICD-10-CM | POA: Insufficient documentation

## 2012-11-21 MED ORDER — CEPHALEXIN 500 MG PO CAPS
500.0000 mg | ORAL_CAPSULE | Freq: Once | ORAL | Status: AC
  Administered 2012-11-21: 500 mg via ORAL
  Filled 2012-11-21: qty 1

## 2012-11-21 MED ORDER — HYDROCODONE-ACETAMINOPHEN 5-325 MG PO TABS
1.0000 | ORAL_TABLET | Freq: Once | ORAL | Status: AC
  Administered 2012-11-21: 1 via ORAL
  Filled 2012-11-21: qty 1

## 2012-11-21 MED ORDER — CEPHALEXIN 500 MG PO CAPS: 500.0000 mg | ORAL_CAPSULE | Freq: Four times a day (QID) | ORAL | Status: DC

## 2012-11-21 MED ORDER — MICONAZOLE NITRATE 2 % EX CREA: TOPICAL_CREAM | Freq: Two times a day (BID) | CUTANEOUS | Status: DC

## 2012-11-21 MED ORDER — HYDROCODONE-ACETAMINOPHEN 5-325 MG PO TABS: ORAL_TABLET | ORAL | Status: DC

## 2012-11-21 NOTE — ED Notes (Signed)
Patient with no complaints at this time. Respirations even and unlabored. Skin warm/dry. Discharge instructions reviewed with patient at this time. Patient given opportunity to voice concerns/ask questions. Patient discharged at this time and left Emergency Department with steady gait.   

## 2012-11-21 NOTE — ED Notes (Signed)
Seen for toe infection to right foot x 2 months ago.  Reports great toe pain to right foot x 1 month.  Denies injury.

## 2012-11-23 NOTE — ED Provider Notes (Signed)
History     CSN: [id number]  Arrival date & time 11/21/12  1116   First MD Initiated Contact with Patient 11/21/12 1211      Chief Complaint  Patient presents with  . Foot Pain    (Consider location/radiation/quality/duration/timing/severity/associated sxs/prior treatment) HPI Comments: Patient c/o pain, burning and itching between the right first and second toes.  States she was seen here several months ago for same.  States sx's improved somewhat but have worsened recently.  Also c/o pain across top her left great toe.  Denies numbness, recent injury, or open wound to the foot.  No hx of diabetes   Patient is a 42 y.o. female presenting with lower extremity pain. The history is provided by the patient.  Foot Pain This is a recurrent problem. The current episode started 1 to 4 weeks ago. The problem occurs constantly. The problem has been waxing and waning. Associated symptoms include arthralgias and a rash. Pertinent negatives include no chills, fatigue, fever, joint swelling, nausea, numbness, swollen glands, urinary symptoms, vertigo, visual change, vomiting or weakness. The symptoms are aggravated by standing and walking (palpation). She has tried nothing for the symptoms. The treatment provided no relief.    Past Medical History  Diagnosis Date  . Bronchitis   . Asthma     Past Surgical History  Procedure Date  . Abdominal hysterectomy   . Tumor excision   . Abdominal hysterectomy     Family History  Problem Relation Age of Onset  . Cancer Father   . Diabetes Father     History  Substance Use Topics  . Smoking status: Current Every Day Smoker    Types: Cigarettes  . Smokeless tobacco: Not on file  . Alcohol Use: No    OB History    Grav Para Term Preterm Abortions TAB SAB Ect Mult Living   4 4 4              Review of Systems  Constitutional: Negative for fever, chills and fatigue.  Gastrointestinal: Negative for nausea and vomiting.  Genitourinary:  Negative for dysuria and difficulty urinating.  Musculoskeletal: Positive for arthralgias. Negative for joint swelling.  Skin: Positive for rash. Negative for color change and wound.  Neurological: Negative for vertigo, weakness and numbness.  All other systems reviewed and are negative.    Allergies  Latex and Pineapple  Home Medications   Current Outpatient Rx  Name  Route  Sig  Dispense  Refill  . ASPIRIN-ACETAMINOPHEN-CAFFEINE 250-250-65 MG PO TABS   Oral   Take 2 tablets by mouth every 6 (six) hours as needed.          . CEPHALEXIN 500 MG PO CAPS   Oral   Take 1 capsule (500 mg total) by mouth 4 (four) times daily.   40 capsule   0   . HYDROCODONE-ACETAMINOPHEN 5-325 MG PO TABS      Take one-two tabs po q 4-6 hrs prn pain   12 tablet   0   . IBUPROFEN 200 MG PO TABS   Oral   Take 400 mg by mouth every 8 (eight) hours as needed. migraines         . MICONAZOLE NITRATE 2 % EX CREA   Topical   Apply topically 2 (two) times daily.   28.35 g   0   . PROMETHAZINE HCL 25 MG PO TABS   Oral   Take 1 tablet (25 mg total) by mouth every 6 (six) hours as  needed for nausea.   10 tablet   0   . DAYQUIL/NYQUIL COLD/FLU RELIEF PO   Oral   Take 5 mLs by mouth 2 (two) times daily.           There were no vitals taken for this visit.  Physical Exam  Nursing note and vitals reviewed. Constitutional: She is oriented to person, place, and time. She appears well-developed and well-nourished. No distress.  HENT:  Head: Normocephalic and atraumatic.  Cardiovascular: Normal rate, regular rhythm, normal heart sounds and intact distal pulses.   Pulmonary/Chest: Effort normal and breath sounds normal.  Musculoskeletal: She exhibits tenderness. She exhibits no edema.       Right foot: She exhibits tenderness. She exhibits normal range of motion, no bony tenderness, no swelling, normal capillary refill, no crepitus, no deformity and no laceration.       Feet:       ttp of  the dorsal left great toe.  darkening of the skin and scant drainage of the web space between the first and second toes.Marland Kitchen  ROM is preserved.  DP pulse is brisk, sensation intact.  No erythema, abrasion, bruising or bony deformity.    Neurological: She is alert and oriented to person, place, and time. She exhibits normal muscle tone. Coordination normal.  Skin: Skin is warm and dry.    ED Course  Procedures (including critical care time)  Labs Reviewed - No data to display No results found.   1. Tinea pedis of right foot       MDM    Pt agrees to keep foot dry.  Doubt cellulitis or abscess.  Will f/u with PMD and also given podiatry referral  prescribed Keflex norco #12 Miconazole cream       Daven Pinckney L. Amani Nodarse, [place] 11/23/12 1353

## 2012-11-24 NOTE — ED Provider Notes (Signed)
Medical screening examination/treatment/procedure(s) were performed by non-physician practitioner and as supervising physician I was immediately available for consultation/collaboration.   Amita Atayde L Kelsei Defino, MD 11/24/12 0711 

## 2012-12-29 ENCOUNTER — Emergency Department (HOSPITAL_COMMUNITY)
Admission: EM | Admit: 2012-12-29 | Discharge: 2012-12-29 | Disposition: A | Discharge status: Home / Self Care | Payer: Self-pay | Attending: Emergency Medicine | Admitting: Emergency Medicine

## 2012-12-29 ENCOUNTER — Encounter (HOSPITAL_COMMUNITY): Payer: Self-pay

## 2012-12-29 DIAGNOSIS — B351 Tinea unguium: Secondary | ICD-10-CM | POA: Insufficient documentation

## 2012-12-29 DIAGNOSIS — B353 Tinea pedis: Secondary | ICD-10-CM | POA: Insufficient documentation

## 2012-12-29 DIAGNOSIS — L299 Pruritus, unspecified: Secondary | ICD-10-CM | POA: Insufficient documentation

## 2012-12-29 DIAGNOSIS — F17200 Nicotine dependence, unspecified, uncomplicated: Secondary | ICD-10-CM | POA: Insufficient documentation

## 2012-12-29 DIAGNOSIS — Z79899 Other long term (current) drug therapy: Secondary | ICD-10-CM | POA: Insufficient documentation

## 2012-12-29 DIAGNOSIS — J45909 Unspecified asthma, uncomplicated: Secondary | ICD-10-CM | POA: Insufficient documentation

## 2012-12-29 MED ORDER — CLOTRIMAZOLE 1 % EX CREA: TOPICAL_CREAM | CUTANEOUS | Status: DC

## 2012-12-29 NOTE — ED Notes (Signed)
Patient with no complaints at this time. Respirations even and unlabored. Skin warm/dry. Discharge instructions reviewed with patient at this time. Patient given opportunity to voice concerns/ask questions. Patient discharged at this time and left Emergency Department with steady gait.   

## 2012-12-29 NOTE — ED Provider Notes (Signed)
Medical screening examination/treatment/procedure(s) were performed by non-physician practitioner and as supervising physician I was immediately available for consultation/collaboration.  Alek Borges, MD 12/29/12 1429 

## 2012-12-29 NOTE — ED Notes (Signed)
Pt c/o r great toe pain for the past 1 1/2 years.

## 2012-12-29 NOTE — ED Provider Notes (Signed)
History     CSN: [id number]  Arrival date & time 12/29/12  4098   First MD Initiated Contact with Patient 12/29/12 0830      Chief Complaint  Patient presents with  . Toe Pain    (Consider location/radiation/quality/duration/timing/severity/associated sxs/prior treatment) HPI Comments: Danielle Bonilla is a 42 y.o. Female presenting with chronic pain of her right great toe and itching between the toes on her right foot.  Her symptoms started about 1 year ago and she has been treated with antibiotics which did not relieve her symptoms.  She was also prescribed miconazole cream at a visit here last month but was unable to get filled due to cost.  She denies fevers , chills,  Drainage from the toe, and has had no swelling or radiation of pain proximal to the toe.  She has taken ibuprofen most recently with no relief of symptoms.     The history is provided by the patient.    Past Medical History  Diagnosis Date  . Bronchitis   . Asthma     Past Surgical History  Procedure Laterality Date  . Abdominal hysterectomy    . Tumor excision    . Abdominal hysterectomy      Family History  Problem Relation Age of Onset  . Cancer Father   . Diabetes Father     History  Substance Use Topics  . Smoking status: Current Every Day Smoker    Types: Cigarettes  . Smokeless tobacco: Not on file  . Alcohol Use: No    OB History   Grav Para Term Preterm Abortions TAB SAB Ect Mult Living   4 4 4              Review of Systems  Constitutional: Negative for fever and chills.  HENT: Negative for congestion and sore throat.   Eyes: Negative.   Respiratory: Negative for cough and shortness of breath.   Cardiovascular: Negative for chest pain.  Gastrointestinal: Negative for abdominal pain.  Genitourinary: Negative.   Musculoskeletal: Positive for arthralgias. Negative for joint swelling.  Skin: Negative.  Negative for rash and wound.  Neurological: Negative for dizziness, weakness,  light-headedness, numbness and headaches.  Psychiatric/Behavioral: Negative.     Allergies  Latex and Pineapple  Home Medications   Current Outpatient Rx  Name  Route  Sig  Dispense  Refill  . aspirin-acetaminophen-caffeine (EXCEDRIN MIGRAINE) 250-250-65 MG per tablet   Oral   Take 2 tablets by mouth every 6 (six) hours as needed.          Marland Kitchen ibuprofen (ADVIL,MOTRIN) 200 MG tablet   Oral   Take 400 mg by mouth every 8 (eight) hours as needed. migraines         . cephALEXin (KEFLEX) 500 MG capsule   Oral   Take 1 capsule (500 mg total) by mouth 4 (four) times daily.   40 capsule   0   . clotrimazole (LOTRIMIN) 1 % cream      Apply to affected area 2 times daily   15 g   2   . HYDROcodone-acetaminophen (NORCO/VICODIN) 5-325 MG per tablet      Take one-two tabs po q 4-6 hrs prn pain   12 tablet   0   . miconazole (MICOTIN) 2 % cream   Topical   Apply topically 2 (two) times daily.   28.35 g   0   . promethazine (PHENERGAN) 25 MG tablet   Oral   Take 1 tablet (  25 mg total) by mouth every 6 (six) hours as needed for nausea.   10 tablet   0   . Pseudoeph-Doxylamine-DM-APAP (DAYQUIL/NYQUIL COLD/FLU RELIEF PO)   Oral   Take 5 mLs by mouth 2 (two) times daily.           BP 115/80  Pulse 77  Temp(Src) 98 F (36.7 C) (Oral)  Resp 18  Ht 5\' 7"  (1.702 m)  Wt 230 lb (104.327 kg)  BMI 36.01 kg/m2  SpO2 100%  Physical Exam  Constitutional: She appears well-developed and well-nourished.  HENT:  Head: Atraumatic.  Neck: Normal range of motion.  Cardiovascular:  Pulses equal bilaterally  Musculoskeletal: She exhibits tenderness.  ttp at right distal great toe, no erythema.  Great toenail is thickened and darker than the remaining tonails.  There is no drainage from around the nail,  No edema of cuticle.  Scaling between great and second toe.  Great toenail is more dome shaped than the other nails,  No is no evidence for ingrown nail,  As complete distal nail  plate edge is visible.  Neurological: She is alert. She has normal strength. She displays normal reflexes. No sensory deficit.  Equal strength  Skin: Skin is warm and dry.  Psychiatric: She has a normal mood and affect.    ED Course  Procedures (including critical care time)  Labs Reviewed - No data to display No results found.   1. Tinea pedis   2. Onychomycosis       MDM  Pt prescribed clotrimazole cream which appears to be less expensive than miconazole per source checked.  She was also advised that she will need oral antifungal meds for up to 1 year which will require blood work monitoring to address the nail infection.  Advised to establish care with the health dept which she agrees to do.          Burgess Amor, [place] 12/29/12 [phone]

## 2013-03-21 ENCOUNTER — Emergency Department (HOSPITAL_COMMUNITY)
Admission: EM | Admit: 2013-03-21 | Discharge: 2013-03-21 | Disposition: A | Discharge status: Home / Self Care | Payer: Self-pay

## 2013-03-21 NOTE — ED Notes (Signed)
No answer

## 2013-05-11 ENCOUNTER — Encounter (HOSPITAL_COMMUNITY): Payer: Self-pay | Admitting: Emergency Medicine

## 2013-05-11 ENCOUNTER — Emergency Department (HOSPITAL_COMMUNITY)
Admission: EM | Admit: 2013-05-11 | Discharge: 2013-05-11 | Disposition: A | Discharge status: Home / Self Care | Payer: Self-pay | Attending: Emergency Medicine | Admitting: Emergency Medicine

## 2013-05-11 DIAGNOSIS — L299 Pruritus, unspecified: Secondary | ICD-10-CM | POA: Insufficient documentation

## 2013-05-11 DIAGNOSIS — Z91040 Latex allergy status: Secondary | ICD-10-CM | POA: Insufficient documentation

## 2013-05-11 DIAGNOSIS — Z862 Personal history of diseases of the blood and blood-forming organs and certain disorders involving the immune mechanism: Secondary | ICD-10-CM | POA: Insufficient documentation

## 2013-05-11 DIAGNOSIS — J45909 Unspecified asthma, uncomplicated: Secondary | ICD-10-CM | POA: Insufficient documentation

## 2013-05-11 DIAGNOSIS — T40605A Adverse effect of unspecified narcotics, initial encounter: Secondary | ICD-10-CM | POA: Insufficient documentation

## 2013-05-11 DIAGNOSIS — K089 Disorder of teeth and supporting structures, unspecified: Secondary | ICD-10-CM | POA: Insufficient documentation

## 2013-05-11 DIAGNOSIS — K029 Dental caries, unspecified: Secondary | ICD-10-CM | POA: Insufficient documentation

## 2013-05-11 DIAGNOSIS — T7840XA Allergy, unspecified, initial encounter: Secondary | ICD-10-CM

## 2013-05-11 DIAGNOSIS — R21 Rash and other nonspecific skin eruption: Secondary | ICD-10-CM | POA: Insufficient documentation

## 2013-05-11 DIAGNOSIS — F17200 Nicotine dependence, unspecified, uncomplicated: Secondary | ICD-10-CM | POA: Insufficient documentation

## 2013-05-11 DIAGNOSIS — Z888 Allergy status to other drugs, medicaments and biological substances status: Secondary | ICD-10-CM | POA: Insufficient documentation

## 2013-05-11 HISTORY — DX: Anemia, unspecified: D64.9

## 2013-05-11 MED ORDER — FAMOTIDINE 20 MG PO TABS: 20.0000 mg | ORAL_TABLET | Freq: Two times a day (BID) | ORAL | Status: DC

## 2013-05-11 MED ORDER — BUPIVACAINE HCL (PF) 0.25 % IJ SOLN
INTRAMUSCULAR | Status: AC
  Administered 2013-05-11: 21:00:00
  Filled 2013-05-11: qty 30

## 2013-05-11 MED ORDER — DIPHENHYDRAMINE HCL 25 MG PO TABS: 25.0000 mg | ORAL_TABLET | Freq: Four times a day (QID) | ORAL | Status: DC

## 2013-05-11 MED ORDER — DIPHENHYDRAMINE HCL 25 MG PO CAPS
25.0000 mg | ORAL_CAPSULE | Freq: Once | ORAL | Status: AC
  Administered 2013-05-11: 25 mg via ORAL
  Filled 2013-05-11: qty 1

## 2013-05-11 MED ORDER — IBUPROFEN 800 MG PO TABS
800.0000 mg | ORAL_TABLET | Freq: Once | ORAL | Status: AC
  Administered 2013-05-11: 800 mg via ORAL
  Filled 2013-05-11: qty 1

## 2013-05-11 MED ORDER — NAPROXEN 375 MG PO TABS: 375.0000 mg | ORAL_TABLET | Freq: Two times a day (BID) | ORAL | Status: DC

## 2013-05-11 MED ORDER — AMOXICILLIN 250 MG PO CAPS: 250.0000 mg | ORAL_CAPSULE | Freq: Three times a day (TID) | ORAL | Status: DC

## 2013-05-11 MED ORDER — FAMOTIDINE 20 MG PO TABS
20.0000 mg | ORAL_TABLET | Freq: Once | ORAL | Status: AC
  Administered 2013-05-11: 20 mg via ORAL
  Filled 2013-05-11: qty 1

## 2013-05-11 NOTE — ED Provider Notes (Signed)
History    CSN: [id number] Arrival date & time 05/11/13  2026  First MD Initiated Contact with Patient 05/11/13 2032     Chief Complaint  Patient presents with  . Dental Pain   (Consider location/radiation/quality/duration/timing/severity/associated sxs/prior Treatment) Patient is a 42 y.o. female presenting with tooth pain. The history is provided by the patient.  Dental Pain Location:  Lower Lower teeth location:  31/RL 2nd molar and 30/RL 1st molar Quality:  Throbbing and constant Severity:  Severe Onset quality:  Gradual Duration:  2 days Timing:  Constant Progression:  Worsening Chronicity:  Recurrent Context: dental caries   Worsened by:  Hot food/drink, cold food/drink, touching, pressure and jaw movement Ineffective treatments: hydrocodone. Associated symptoms: gum swelling   Associated symptoms: no drooling, no fever, no headaches, no neck swelling and no oral bleeding    Danielle Bonilla is a 42 y.o. female who presents to the ED with dental pain that started this morning. The pain has gotten worse as the day has gone on. She took hydrocodone at 6 pm and then developed a rash and itching but the pain continues.  Past Medical History  Diagnosis Date  . Bronchitis   . Asthma   . Anemia    Past Surgical History  Procedure Laterality Date  . Abdominal hysterectomy    . Tumor excision    . Abdominal hysterectomy     Family History  Problem Relation Age of Onset  . Cancer Father   . Diabetes Father    History  Substance Use Topics  . Smoking status: Current Every Day Smoker    Types: Cigarettes  . Smokeless tobacco: Not on file  . Alcohol Use: No   OB History   Grav Para Term Preterm Abortions TAB SAB Ect Mult Living   4 4 4             Review of Systems  Constitutional: Negative for fever.  HENT: Positive for dental problem. Negative for sore throat, drooling and trouble swallowing.   Eyes: Negative for itching.  Gastrointestinal: Negative for nausea  and vomiting.  Skin: Positive for rash.  Neurological: Negative for dizziness and headaches.  Psychiatric/Behavioral: The patient is not nervous/anxious.     Allergies  Latex and Pineapple  Home Medications   Current Outpatient Rx  Name  Route  Sig  Dispense  Refill  . aspirin-acetaminophen-caffeine (EXCEDRIN MIGRAINE) 250-250-65 MG per tablet   Oral   Take 2 tablets by mouth every 6 (six) hours as needed.          . cephALEXin (KEFLEX) 500 MG capsule   Oral   Take 1 capsule (500 mg total) by mouth 4 (four) times daily.   40 capsule   0   . clotrimazole (LOTRIMIN) 1 % cream      Apply to affected area 2 times daily   15 g   2   . HYDROcodone-acetaminophen (NORCO/VICODIN) 5-325 MG per tablet      Take one-two tabs po q 4-6 hrs prn pain   12 tablet   0   . ibuprofen (ADVIL,MOTRIN) 200 MG tablet   Oral   Take 400 mg by mouth every 8 (eight) hours as needed. migraines         . miconazole (MICOTIN) 2 % cream   Topical   Apply topically 2 (two) times daily.   28.35 g   0   . promethazine (PHENERGAN) 25 MG tablet   Oral   Take 1 tablet (  25 mg total) by mouth every 6 (six) hours as needed for nausea.   10 tablet   0   . Pseudoeph-Doxylamine-DM-APAP (DAYQUIL/NYQUIL COLD/FLU RELIEF PO)   Oral   Take 5 mLs by mouth 2 (two) times daily.          BP 123/75  Pulse 67  Temp(Src) 98.3 F (36.8 C) (Oral)  Resp 20  Ht 5\' 7"  (1.702 m)  Wt 235 lb (106.595 kg)  BMI 36.8 kg/m2  SpO2 100% Physical Exam  Nursing note and vitals reviewed. Constitutional: She is oriented to person, place, and time. She appears well-developed and well-nourished. No distress.  HENT:  Head: Normocephalic.  Mouth/Throat: Uvula is midline, oropharynx is clear and moist and mucous membranes are normal. Dental caries present.    Eyes: Conjunctivae and EOM are normal.  Neck: Neck supple.  Cardiovascular: Normal rate and regular rhythm.   Pulmonary/Chest: Effort normal and breath  sounds normal.  Musculoskeletal: Normal range of motion.  Neurological: She is alert and oriented to person, place, and time. No cranial nerve deficit.  Skin: Skin is warm and dry. Rash noted.  Small hive like areas noted chest and arms.  Psychiatric: She has a normal mood and affect. Her behavior is normal.    ED Course  Procedures Bupivacine 0.25% 1 cc injected in area of dental pain.  Patient re examined. Rash improved with Benadryl and Pepcid. No itching. No respiratory symptoms. Pain improved after injected. Patient states she is ready to go.  MDM  42 y.o. female with dental pain due to caries right lower molars. Allergic reaction to hydrocodone.  Discussed with the patient clinical findings and plan of care. All questioned fully answered. She will return if any problems arise.    Medication List    STOP taking these medications       cephALEXin 500 MG capsule  Commonly known as:  KEFLEX     HYDROcodone-acetaminophen 5-325 MG per tablet  Commonly known as:  NORCO/VICODIN      TAKE these medications       amoxicillin 250 MG capsule  Commonly known as:  AMOXIL  Take 1 capsule (250 mg total) by mouth 3 (three) times daily.     diphenhydrAMINE 25 MG tablet  Commonly known as:  BENADRYL  Take 1 tablet (25 mg total) by mouth every 6 (six) hours.     famotidine 20 MG tablet  Commonly known as:  PEPCID  Take 1 tablet (20 mg total) by mouth 2 (two) times daily.     naproxen 375 MG tablet  Commonly known as:  NAPROSYN  Take 1 tablet (375 mg total) by mouth 2 (two) times daily.      ASK your doctor about these medications       aspirin-acetaminophen-caffeine 250-250-65 MG per tablet  Commonly known as:  EXCEDRIN MIGRAINE  Take 2 tablets by mouth every 6 (six) hours as needed.     clotrimazole 1 % cream  Commonly known as:  LOTRIMIN  Apply to affected area 2 times daily     DAYQUIL/NYQUIL COLD/FLU RELIEF PO  Take 5 mLs by mouth 2 (two) times daily.     ibuprofen  200 MG tablet  Commonly known as:  ADVIL,MOTRIN  Take 400 mg by mouth every 8 (eight) hours as needed. migraines     miconazole 2 % cream  Commonly known as:  MICOTIN  Apply topically 2 (two) times daily.     promethazine 25 MG tablet  Commonly  known as:  PHENERGAN  Take 1 tablet (25 mg total) by mouth every 6 (six) hours as needed for nausea.         [place], [place] 05/11/13 2112

## 2013-05-11 NOTE — ED Notes (Signed)
Pt c/o right side lower dental pain.

## 2013-05-11 NOTE — ED Notes (Signed)
Patient c/o right lower dental pain that started this morning.  States took a Hydrocodone at 6pm without relief.

## 2013-05-12 NOTE — ED Provider Notes (Signed)
Medical screening examination/treatment/procedure(s) were performed by non-physician practitioner and as supervising physician I was immediately available for consultation/collaboration.   Carleene Cooper III, MD 05/12/13 [phone]

## 2013-06-06 ENCOUNTER — Emergency Department (HOSPITAL_COMMUNITY)
Admission: EM | Admit: 2013-06-06 | Discharge: 2013-06-06 | Disposition: A | Discharge status: Home / Self Care | Payer: Self-pay | Attending: Emergency Medicine | Admitting: Emergency Medicine

## 2013-06-06 ENCOUNTER — Encounter (HOSPITAL_COMMUNITY): Payer: Self-pay

## 2013-06-06 DIAGNOSIS — Z8709 Personal history of other diseases of the respiratory system: Secondary | ICD-10-CM | POA: Insufficient documentation

## 2013-06-06 DIAGNOSIS — Z91040 Latex allergy status: Secondary | ICD-10-CM | POA: Insufficient documentation

## 2013-06-06 DIAGNOSIS — M549 Dorsalgia, unspecified: Secondary | ICD-10-CM

## 2013-06-06 DIAGNOSIS — Z79899 Other long term (current) drug therapy: Secondary | ICD-10-CM | POA: Insufficient documentation

## 2013-06-06 DIAGNOSIS — F17200 Nicotine dependence, unspecified, uncomplicated: Secondary | ICD-10-CM | POA: Insufficient documentation

## 2013-06-06 DIAGNOSIS — M545 Low back pain, unspecified: Secondary | ICD-10-CM | POA: Insufficient documentation

## 2013-06-06 DIAGNOSIS — Z862 Personal history of diseases of the blood and blood-forming organs and certain disorders involving the immune mechanism: Secondary | ICD-10-CM | POA: Insufficient documentation

## 2013-06-06 DIAGNOSIS — J45909 Unspecified asthma, uncomplicated: Secondary | ICD-10-CM | POA: Insufficient documentation

## 2013-06-06 MED ORDER — KETOROLAC TROMETHAMINE 60 MG/2ML IM SOLN
60.0000 mg | Freq: Once | INTRAMUSCULAR | Status: AC
  Administered 2013-06-06: 60 mg via INTRAMUSCULAR
  Filled 2013-06-06: qty 2

## 2013-06-06 MED ORDER — HYDROCODONE-ACETAMINOPHEN 5-325 MG PO TABS: 2.0000 | ORAL_TABLET | ORAL | Status: DC | PRN

## 2013-06-06 MED ORDER — PREDNISONE 20 MG PO TABS: 40.0000 mg | ORAL_TABLET | Freq: Every day | ORAL | Status: DC

## 2013-06-06 MED ORDER — DEXAMETHASONE SODIUM PHOSPHATE 10 MG/ML IJ SOLN
10.0000 mg | Freq: Once | INTRAMUSCULAR | Status: AC
  Administered 2013-06-06: 10 mg via INTRAMUSCULAR
  Filled 2013-06-06: qty 1

## 2013-06-06 MED ORDER — METHOCARBAMOL 500 MG PO TABS: 500.0000 mg | ORAL_TABLET | Freq: Two times a day (BID) | ORAL | Status: DC | PRN

## 2013-06-06 MED ORDER — NAPROXEN 500 MG PO TABS: 500.0000 mg | ORAL_TABLET | Freq: Two times a day (BID) | ORAL | Status: DC

## 2013-06-06 NOTE — ED Notes (Signed)
Pt c/o chronic lower back pain radiating down left leg.  Reports pain flared up yesterday.  Denies new injury.

## 2013-06-06 NOTE — ED Provider Notes (Signed)
History  This chart was scribed for Danielle Roller, MD, by Yevette Edwards, ED Scribe. This patient was seen in room [facility]01/[facility]01 and the patient's care was started at 10:10 AM.  CSN: [id number] Arrival date & time 06/06/13  0949  First MD Initiated Contact with Patient 06/06/13 [phone]     Chief Complaint  Patient presents with  . Back Pain    The history is provided by the patient. No language interpreter was used.   HPI Comments: Danielle Bonilla is a 42 y.o. female who presents to the Emergency Department complaining of constant lumbar back pain which radiates down her left leg. 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 She reports that this recent episode of pain feels like it is "locked" and "shooting." She states that it is in a similar location to her previous episodes of back pain. However, she states that this current pain is worse than any previous episodes, and she states that in previous episodes the pain did not radiate down her left leg. The pt also reports that in the past week her left leg has intermittently felt numb, especially if she is sitting. She reports that resting on her side with knees bent helps lessen the pain, and straightening her back increases the pain. She denies a fever, chills, dysuria, or emesis. She also denies a h/o of  needle usage or cancer.    Past Medical History  Diagnosis Date  . Bronchitis   . Asthma   . Anemia    Past Surgical History  Procedure Laterality Date  . Abdominal hysterectomy    . Tumor excision    . Abdominal hysterectomy     Family History  Problem Relation Age of Onset  . Cancer Father   . Diabetes Father    History  Substance Use Topics  . Smoking status: Current Every Day Smoker    Types: Cigarettes  . Smokeless tobacco: Not on file  . Alcohol Use: No   OB History   Grav Para Term  Preterm Abortions TAB SAB Ect Mult Living   4 4 4             Review of Systems A complete 10 system review of systems was obtained, and all systems were negative except where indicated in the HPI and PE.   Allergies  Latex and Pineapple  Home Medications   Current Outpatient Rx  Name  Route  Sig  Dispense  Refill  . amoxicillin (AMOXIL) 250 MG capsule   Oral   Take 1 capsule (250 mg total) by mouth 3 (three) times daily.   21 capsule   0   . aspirin-acetaminophen-caffeine (EXCEDRIN MIGRAINE) 250-250-65 MG per tablet   Oral   Take 2 tablets by mouth every 6 (six) hours as needed.          . clotrimazole (LOTRIMIN) 1 % cream      Apply to affected area 2 times daily   15 g   2   . diphenhydrAMINE (BENADRYL) 25 MG tablet   Oral   Take 1 tablet (25 mg total) by mouth every 6 (six) hours.   20 tablet   0   . famotidine (PEPCID) 20 MG tablet   Oral   Take 1 tablet (20 mg total) by mouth 2 (two) times daily.   15 tablet  0   . HYDROcodone-acetaminophen (NORCO/VICODIN) 5-325 MG per tablet   Oral   Take 2 tablets by mouth every 4 (four) hours as needed for pain.   10 tablet   0   . ibuprofen (ADVIL,MOTRIN) 200 MG tablet   Oral   Take 400 mg by mouth every 8 (eight) hours as needed. migraines         . methocarbamol (ROBAXIN) 500 MG tablet   Oral   Take 1 tablet (500 mg total) by mouth 2 (two) times daily as needed.   20 tablet   0   . miconazole (MICOTIN) 2 % cream   Topical   Apply topically 2 (two) times daily.   28.35 g   0   . naproxen (NAPROSYN) 375 MG tablet   Oral   Take 1 tablet (375 mg total) by mouth 2 (two) times daily.   20 tablet   0   . naproxen (NAPROSYN) 500 MG tablet   Oral   Take 1 tablet (500 mg total) by mouth 2 (two) times daily with a meal.   30 tablet   0   . predniSONE (DELTASONE) 20 MG tablet   Oral   Take 2 tablets (40 mg total) by mouth daily. Take 40 mg by mouth daily for 3 days, then 20mg  by mouth daily for 3 days,  then 10mg  daily for 3 days   12 tablet   0   . promethazine (PHENERGAN) 25 MG tablet   Oral   Take 1 tablet (25 mg total) by mouth every 6 (six) hours as needed for nausea.   10 tablet   0   . Pseudoeph-Doxylamine-DM-APAP (DAYQUIL/NYQUIL COLD/FLU RELIEF PO)   Oral   Take 5 mLs by mouth 2 (two) times daily.          Triage Vitals:  BP 93/51  Pulse 66  Temp(Src) 98.9 F (37.2 C) (Oral)  Ht 5\' 7"  (1.702 m)  Wt 230 lb (104.327 kg)  BMI 36.01 kg/m2  SpO2 97%  Physical Exam  Nursing note and vitals reviewed. Constitutional: She is oriented to person, place, and time. She appears well-developed and well-nourished. No distress.  HENT:  Head: Normocephalic and atraumatic.  Mouth/Throat: Oropharynx is clear and moist. No oropharyngeal exudate.  Eyes: Conjunctivae and EOM are normal. Pupils are equal, round, and reactive to light. Right eye exhibits no discharge. Left eye exhibits no discharge. No scleral icterus.  Neck: Normal range of motion. Neck supple. No JVD present. No tracheal deviation present. No thyromegaly present.  Cardiovascular: Normal rate, regular rhythm, normal heart sounds and intact distal pulses.  Exam reveals no gallop and no friction rub.   No murmur heard. Pulmonary/Chest: Effort normal and breath sounds normal. No respiratory distress. She has no wheezes. She has no rales.  Musculoskeletal: Normal range of motion. She exhibits no edema and no tenderness.  TEnderness to mid lumbar area as well as to the left upper buttock.   Lymphadenopathy:    She has no cervical adenopathy.  Neurological: She is alert and oriented to person, place, and time. Coordination normal.  Normal strength and sensation in the left leg.   Skin: Skin is warm and dry. No rash noted. No erythema.  Psychiatric: She has a normal mood and affect. Her behavior is normal.    ED Course  Procedures (including critical care time)  DIAGNOSTIC STUDIES: Oxygen Saturation is 97% on room air,  normal by my interpretation.    COORDINATION OF CARE:  10:14 AM-Discussed treatment plan with patient which includes pain medication and a follow-up with an orthopedist, and the patient agreed to the plan.    Labs Reviewed - No data to display No results found. 1. Back pain     MDM  The patient seems to have benign back pain, this is reproducible, she does not have any neurologic deficits, she is able to lift and move both of her lower extremities without difficulty with normal strength and sensation. She has no pathologic reflex for back pain, she can be discharged on symptomatic medications without imaging. She has been given return precautions and has expressed her understanding.  Meds given in ED:  Medications  ketorolac (TORADOL) injection 60 mg (60 mg Intramuscular Given 06/06/13 1048)  dexamethasone (DECADRON) injection 10 mg (10 mg Intramuscular Given 06/06/13 1048)    Discharge Medication List as of 06/06/2013 10:21 AM    START taking these medications   Details  HYDROcodone-acetaminophen (NORCO/VICODIN) 5-325 MG per tablet Take 2 tablets by mouth every 4 (four) hours as needed for pain., Starting 06/06/2013, Until Discontinued, Print    methocarbamol (ROBAXIN) 500 MG tablet Take 1 tablet (500 mg total) by mouth 2 (two) times daily as needed., Starting 06/06/2013, Until Discontinued, Print    !! naproxen (NAPROSYN) 500 MG tablet Take 1 tablet (500 mg total) by mouth 2 (two) times daily with a meal., Starting 06/06/2013, Until Discontinued, Print    predniSONE (DELTASONE) 20 MG tablet Take 2 tablets (40 mg total) by mouth daily. Take 40 mg by mouth daily for 3 days, then 20mg  by mouth daily for 3 days, then 10mg  daily for 3 days, Starting 06/06/2013, Until Discontinued, Print     !! - Potential duplicate medications found. Please discuss with provider.        I personally performed the services described in this documentation, which was scribed in my presence. The recorded  information has been reviewed and is accurate.      Danielle Roller, MD 06/07/13 [phone]

## 2013-09-15 ENCOUNTER — Encounter (HOSPITAL_COMMUNITY): Payer: Self-pay | Admitting: Emergency Medicine

## 2013-09-15 ENCOUNTER — Emergency Department (HOSPITAL_COMMUNITY)
Admission: EM | Admit: 2013-09-15 | Discharge: 2013-09-15 | Disposition: A | Discharge status: Home / Self Care | Payer: Self-pay | Attending: Emergency Medicine | Admitting: Emergency Medicine

## 2013-09-15 ENCOUNTER — Emergency Department (HOSPITAL_COMMUNITY): Payer: Self-pay

## 2013-09-15 DIAGNOSIS — J209 Acute bronchitis, unspecified: Secondary | ICD-10-CM

## 2013-09-15 DIAGNOSIS — Z791 Long term (current) use of non-steroidal anti-inflammatories (NSAID): Secondary | ICD-10-CM | POA: Insufficient documentation

## 2013-09-15 DIAGNOSIS — Z862 Personal history of diseases of the blood and blood-forming organs and certain disorders involving the immune mechanism: Secondary | ICD-10-CM | POA: Insufficient documentation

## 2013-09-15 DIAGNOSIS — J45901 Unspecified asthma with (acute) exacerbation: Secondary | ICD-10-CM | POA: Insufficient documentation

## 2013-09-15 DIAGNOSIS — Z792 Long term (current) use of antibiotics: Secondary | ICD-10-CM | POA: Insufficient documentation

## 2013-09-15 DIAGNOSIS — IMO0001 Reserved for inherently not codable concepts without codable children: Secondary | ICD-10-CM | POA: Insufficient documentation

## 2013-09-15 DIAGNOSIS — F17200 Nicotine dependence, unspecified, uncomplicated: Secondary | ICD-10-CM | POA: Insufficient documentation

## 2013-09-15 DIAGNOSIS — Z79899 Other long term (current) drug therapy: Secondary | ICD-10-CM | POA: Insufficient documentation

## 2013-09-15 DIAGNOSIS — Z91040 Latex allergy status: Secondary | ICD-10-CM | POA: Insufficient documentation

## 2013-09-15 DIAGNOSIS — J029 Acute pharyngitis, unspecified: Secondary | ICD-10-CM | POA: Insufficient documentation

## 2013-09-15 DIAGNOSIS — J3489 Other specified disorders of nose and nasal sinuses: Secondary | ICD-10-CM | POA: Insufficient documentation

## 2013-09-15 MED ORDER — AZITHROMYCIN 250 MG PO TABS: ORAL_TABLET | ORAL | Status: DC

## 2013-09-15 MED ORDER — PREDNISONE (PAK) 10 MG PO TABS: 10.0000 mg | ORAL_TABLET | Freq: Every day | ORAL | Status: DC

## 2013-09-15 MED ORDER — ALBUTEROL SULFATE HFA 108 (90 BASE) MCG/ACT IN AERS
2.0000 | INHALATION_SPRAY | RESPIRATORY_TRACT | Status: DC | PRN
  Filled 2013-09-15 (×2): qty 6.7

## 2013-09-15 MED ORDER — ALBUTEROL SULFATE (5 MG/ML) 0.5% IN NEBU
5.0000 mg | INHALATION_SOLUTION | Freq: Once | RESPIRATORY_TRACT | Status: AC
  Administered 2013-09-15: 5 mg via RESPIRATORY_TRACT
  Filled 2013-09-15: qty 1

## 2013-09-15 MED ORDER — PHENYLEPH-PROMETHAZINE-COD 5-6.25-10 MG/5ML PO SYRP: 5.0000 mL | ORAL_SOLUTION | ORAL | Status: DC | PRN

## 2013-09-15 MED ORDER — PREDNISONE 50 MG PO TABS
60.0000 mg | ORAL_TABLET | Freq: Once | ORAL | Status: AC
  Administered 2013-09-15: 60 mg via ORAL
  Filled 2013-09-15 (×2): qty 1

## 2013-09-15 MED ORDER — ALBUTEROL SULFATE (5 MG/ML) 0.5% IN NEBU
2.5000 mg | INHALATION_SOLUTION | Freq: Once | RESPIRATORY_TRACT | Status: AC
  Administered 2013-09-15: 2.5 mg via RESPIRATORY_TRACT
  Filled 2013-09-15: qty 0.5

## 2013-09-15 MED ORDER — IPRATROPIUM BROMIDE 0.02 % IN SOLN
0.5000 mg | Freq: Once | RESPIRATORY_TRACT | Status: AC
  Administered 2013-09-15: 0.5 mg via RESPIRATORY_TRACT
  Filled 2013-09-15: qty 2.5

## 2013-09-15 NOTE — ED Provider Notes (Signed)
Patient tolerated the second nebulizer treatment without problem. She also received instructions on an albuterol inhaler. The patient was ambulated in the hall and pulse oximetry remained at 98-99%. The patient is moving air much better. There continues to be some soft wheeze present but much improved from earlier during the admission. It is safe for the patient be discharged home. Patient advised to return to the emergency department immediately if any changes, problems, or concerns. Prescription for Zithromax Z-PAK, promethazine cough medication, and prednisone Dosepak given to the patient.  Kathie Dike, PA-C 09/15/13 [phone]

## 2013-09-15 NOTE — ED Notes (Signed)
Fine rales posterior bases , faint expiratory wheezes L A/P.  Patient states she feels she is breathing a little better.

## 2013-09-15 NOTE — ED Notes (Signed)
Ambulated x 2 in hallway, SPO2 remained 99-100%.

## 2013-09-15 NOTE — ED Provider Notes (Signed)
CSN: [id number]     Arrival date & time 09/15/13  1510 History   First MD Initiated Contact with Patient 09/15/13 1521     Chief Complaint  Patient presents with  . Cough   (Consider location/radiation/quality/duration/timing/severity/associated sxs/prior Treatment) Patient is a 42 y.o. female presenting with cough. The history is provided by the patient.  Cough Cough characteristics:  Productive Sputum characteristics:  Yellow Severity:  Moderate Onset quality:  Gradual Duration:  2 days Timing:  Sporadic Progression:  Worsening Chronicity:  New Smoker: yes   Relieved by:  Nothing Worsened by:  Deep breathing, lying down and smoking Ineffective treatments:  None tried Associated symptoms: myalgias, rhinorrhea, sinus congestion, sore throat and wheezing   Associated symptoms: no ear pain, no fever, no headaches and no rash     Past Medical History  Diagnosis Date  . Bronchitis   . Asthma   . Anemia    Past Surgical History  Procedure Laterality Date  . Abdominal hysterectomy    . Tumor excision    . Abdominal hysterectomy     Family History  Problem Relation Age of Onset  . Cancer Father   . Diabetes Father    History  Substance Use Topics  . Smoking status: Current Every Day Smoker    Types: Cigarettes  . Smokeless tobacco: Not on file  . Alcohol Use: No   OB History   Grav Para Term Preterm Abortions TAB SAB Ect Mult Living   4 4 4             Review of Systems  Constitutional: Negative for fever.  HENT: Positive for rhinorrhea and sore throat. Negative for ear pain.   Eyes: Negative for redness.  Respiratory: Positive for cough and wheezing.   Gastrointestinal: Negative for nausea, vomiting and abdominal pain.  Genitourinary: Negative for dysuria and frequency.  Musculoskeletal: Positive for myalgias.  Skin: Negative for rash.  Neurological: Negative for headaches.  Psychiatric/Behavioral: The patient is not nervous/anxious.     Allergies  Latex  and Pineapple  Home Medications   Current Outpatient Rx  Name  Route  Sig  Dispense  Refill  . amoxicillin (AMOXIL) 250 MG capsule   Oral   Take 1 capsule (250 mg total) by mouth 3 (three) times daily.   21 capsule   0   . aspirin-acetaminophen-caffeine (EXCEDRIN MIGRAINE) 250-250-65 MG per tablet   Oral   Take 2 tablets by mouth every 6 (six) hours as needed.          . clotrimazole (LOTRIMIN) 1 % cream      Apply to affected area 2 times daily   15 g   2   . diphenhydrAMINE (BENADRYL) 25 MG tablet   Oral   Take 1 tablet (25 mg total) by mouth every 6 (six) hours.   20 tablet   0   . famotidine (PEPCID) 20 MG tablet   Oral   Take 1 tablet (20 mg total) by mouth 2 (two) times daily.   15 tablet   0   . HYDROcodone-acetaminophen (NORCO/VICODIN) 5-325 MG per tablet   Oral   Take 2 tablets by mouth every 4 (four) hours as needed for pain.   10 tablet   0   . ibuprofen (ADVIL,MOTRIN) 200 MG tablet   Oral   Take 400 mg by mouth every 8 (eight) hours as needed. migraines         . methocarbamol (ROBAXIN) 500 MG tablet   Oral  Take 1 tablet (500 mg total) by mouth 2 (two) times daily as needed.   20 tablet   0   . miconazole (MICOTIN) 2 % cream   Topical   Apply topically 2 (two) times daily.   28.35 g   0   . naproxen (NAPROSYN) 375 MG tablet   Oral   Take 1 tablet (375 mg total) by mouth 2 (two) times daily.   20 tablet   0   . naproxen (NAPROSYN) 500 MG tablet   Oral   Take 1 tablet (500 mg total) by mouth 2 (two) times daily with a meal.   30 tablet   0   . predniSONE (DELTASONE) 20 MG tablet   Oral   Take 2 tablets (40 mg total) by mouth daily. Take 40 mg by mouth daily for 3 days, then 20mg  by mouth daily for 3 days, then 10mg  daily for 3 days   12 tablet   0   . promethazine (PHENERGAN) 25 MG tablet   Oral   Take 1 tablet (25 mg total) by mouth every 6 (six) hours as needed for nausea.   10 tablet   0   .  Pseudoeph-Doxylamine-DM-APAP (DAYQUIL/NYQUIL COLD/FLU RELIEF PO)   Oral   Take 5 mLs by mouth 2 (two) times daily.          BP 103/58  Pulse 78  Temp(Src) 97.8 F (36.6 C) (Oral)  Ht 5\' 7"  (1.702 m)  Wt 235 lb (106.595 kg)  BMI 36.8 kg/m2  SpO2 100% Physical Exam  Nursing note and vitals reviewed. Constitutional: She is oriented to person, place, and time. She appears well-developed and well-nourished. No distress.  HENT:  Head: Normocephalic.  Mouth/Throat: Uvula is midline, oropharynx is clear and moist and mucous membranes are normal.  Eyes: Conjunctivae and EOM are normal.  Neck: Neck supple.  Cardiovascular: Normal rate, regular rhythm and normal heart sounds.   Pulmonary/Chest: Effort normal. She has decreased breath sounds. She has wheezes. She has rhonchi.  Abdominal: Soft. There is no tenderness.  Musculoskeletal: Normal range of motion.  Neurological: She is alert and oriented to person, place, and time. No cranial nerve deficit.  Skin: Skin is warm and dry.  Psychiatric: She has a normal mood and affect. Her behavior is normal.   Dg Chest 2 View  09/15/2013   CLINICAL DATA:  Cough, congestion and chills.  EXAM: CHEST  2 VIEW  COMPARISON:  CHEST x-ray 12/20/2011.  FINDINGS: Lung volumes are normal. No consolidative airspace disease. No pleural effusions. No pneumothorax. No pulmonary nodule or mass noted. Pulmonary vasculature and the cardiomediastinal silhouette are within normal limits.  IMPRESSION: 1.  No radiographic evidence of acute cardiopulmonary disease.   Electronically Signed   By: Trudie Reed M.D.   On: 09/15/2013 16:30    ED Course: Re examined after albuterol/atrovent neb and prednisone, air movement much improved. More rhonchi and wheezing heard.   Procedures Will repeat albuterol  Neb  MDM  42 y.o. female with acute bronchitis with bronchospasm. Encouraged patient to stop smoking. Will treat with Z-Pak, prednisone and cough medication. Patient is  stable for discharge home without any immediate complications. Vital sings normal and O2 Sat 100% on R/A    [facility], NP 09/15/13 1721

## 2013-09-15 NOTE — ED Notes (Signed)
Patient with no complaints at this time. Respirations even and unlabored. Skin warm/dry. Discharge instructions reviewed with patient at this time. Patient given opportunity to voice concerns/ask questions. Provided information on [facility].   Patient discharged at this time and left Emergency Department with steady gait.

## 2013-09-15 NOTE — ED Notes (Signed)
Patient with no complaints at this time. Respirations even and unlabored. Skin warm/dry. Discharge instructions reviewed with patient at this time. Patient given opportunity to voice concerns/ask questions. Patient discharged at this time and left Emergency Department with steady gait.   

## 2013-09-15 NOTE — ED Notes (Signed)
Coughing started yesterday.  Now she states she is running a fever

## 2013-09-15 NOTE — ED Provider Notes (Signed)
Medical screening examination/treatment/procedure(s) were performed by non-physician practitioner and as supervising physician I was immediately available for consultation/collaboration.  EKG Interpretation   None        Nahum Sherrer, MD 09/15/13 2310 

## 2013-09-15 NOTE — ED Provider Notes (Signed)
Medical screening examination/treatment/procedure(s) were performed by non-physician practitioner and as supervising physician I was immediately available for consultation/collaboration.  EKG Interpretation   None        Sydni Elizarraraz, MD 09/15/13 1821 

## 2014-01-09 ENCOUNTER — Emergency Department (HOSPITAL_COMMUNITY)
Admission: EM | Admit: 2014-01-09 | Discharge: 2014-01-09 | Disposition: A | Discharge status: Home / Self Care | Payer: Self-pay | Attending: Emergency Medicine | Admitting: Emergency Medicine

## 2014-01-09 ENCOUNTER — Encounter (HOSPITAL_COMMUNITY): Payer: Self-pay | Admitting: Emergency Medicine

## 2014-01-09 DIAGNOSIS — Z792 Long term (current) use of antibiotics: Secondary | ICD-10-CM | POA: Insufficient documentation

## 2014-01-09 DIAGNOSIS — F17200 Nicotine dependence, unspecified, uncomplicated: Secondary | ICD-10-CM | POA: Insufficient documentation

## 2014-01-09 DIAGNOSIS — R197 Diarrhea, unspecified: Secondary | ICD-10-CM | POA: Insufficient documentation

## 2014-01-09 DIAGNOSIS — Z90710 Acquired absence of both cervix and uterus: Secondary | ICD-10-CM | POA: Insufficient documentation

## 2014-01-09 DIAGNOSIS — Z862 Personal history of diseases of the blood and blood-forming organs and certain disorders involving the immune mechanism: Secondary | ICD-10-CM | POA: Insufficient documentation

## 2014-01-09 DIAGNOSIS — Z91040 Latex allergy status: Secondary | ICD-10-CM | POA: Insufficient documentation

## 2014-01-09 DIAGNOSIS — J45909 Unspecified asthma, uncomplicated: Secondary | ICD-10-CM | POA: Insufficient documentation

## 2014-01-09 DIAGNOSIS — R109 Unspecified abdominal pain: Secondary | ICD-10-CM | POA: Insufficient documentation

## 2014-01-09 DIAGNOSIS — IMO0002 Reserved for concepts with insufficient information to code with codable children: Secondary | ICD-10-CM | POA: Insufficient documentation

## 2014-01-09 LAB — CBC WITH DIFFERENTIAL/PLATELET
Basophils Absolute: 0 10*3/uL (ref 0.0–0.1)
Basophils Relative: 1 % (ref 0–1)
Eosinophils Absolute: 0.6 10*3/uL (ref 0.0–0.7)
Eosinophils Relative: 7 % — ABNORMAL HIGH (ref 0–5)
HCT: 37.1 % (ref 36.0–46.0)
HEMOGLOBIN: 12.1 g/dL (ref 12.0–15.0)
LYMPHS ABS: 4 10*3/uL (ref 0.7–4.0)
LYMPHS PCT: 48 % — AB (ref 12–46)
MCH: 28.2 pg (ref 26.0–34.0)
MCHC: 32.6 g/dL (ref 30.0–36.0)
MCV: 86.5 fL (ref 78.0–100.0)
MONOS PCT: 5 % (ref 3–12)
Monocytes Absolute: 0.4 10*3/uL (ref 0.1–1.0)
NEUTROS ABS: 3.3 10*3/uL (ref 1.7–7.7)
NEUTROS PCT: 40 % — AB (ref 43–77)
Platelets: 316 10*3/uL (ref 150–400)
RBC: 4.29 MIL/uL (ref 3.87–5.11)
RDW: 14.6 % (ref 11.5–15.5)
WBC: 8.3 10*3/uL (ref 4.0–10.5)

## 2014-01-09 LAB — URINALYSIS, ROUTINE W REFLEX MICROSCOPIC
BILIRUBIN URINE: NEGATIVE
Glucose, UA: NEGATIVE mg/dL
HGB URINE DIPSTICK: NEGATIVE
KETONES UR: NEGATIVE mg/dL
Leukocytes, UA: NEGATIVE
NITRITE: NEGATIVE
Protein, ur: NEGATIVE mg/dL
SPECIFIC GRAVITY, URINE: 1.025 (ref 1.005–1.030)
UROBILINOGEN UA: 0.2 mg/dL (ref 0.0–1.0)
pH: 6 (ref 5.0–8.0)

## 2014-01-09 LAB — BASIC METABOLIC PANEL
BUN: 12 mg/dL (ref 6–23)
CO2: 25 meq/L (ref 19–32)
Calcium: 9.8 mg/dL (ref 8.4–10.5)
Chloride: 101 mEq/L (ref 96–112)
Creatinine, Ser: 0.91 mg/dL (ref 0.50–1.10)
GFR calc Af Amer: 88 mL/min — ABNORMAL LOW (ref >90)
GFR calc non Af Amer: 76 mL/min — ABNORMAL LOW (ref >90)
GLUCOSE: 114 mg/dL — AB (ref 70–99)
POTASSIUM: 3.7 meq/L (ref 3.7–5.3)
Sodium: 139 mEq/L (ref 137–147)

## 2014-01-09 MED ORDER — OXYCODONE-ACETAMINOPHEN 5-325 MG PO TABS
2.0000 | ORAL_TABLET | Freq: Once | ORAL | Status: AC
  Administered 2014-01-09: 2 via ORAL
  Filled 2014-01-09: qty 2

## 2014-01-09 MED ORDER — OXYCODONE-ACETAMINOPHEN 5-325 MG PO TABS
ORAL_TABLET | ORAL | Status: AC
  Filled 2014-01-09: qty 1

## 2014-01-09 MED ORDER — ONDANSETRON 8 MG PO TBDP
8.0000 mg | ORAL_TABLET | Freq: Once | ORAL | Status: AC
  Administered 2014-01-09: 8 mg via ORAL
  Filled 2014-01-09: qty 1

## 2014-01-09 NOTE — ED Notes (Signed)
Pt c/o lower abd pain with n and some diarrhea, also c/o pressure and frequent urination. Denies vomiting.

## 2014-01-09 NOTE — Discharge Instructions (Signed)

## 2014-01-09 NOTE — ED Provider Notes (Signed)
CSN: [id number]     Arrival date & time 01/09/14  1513 History   This chart was scribed for Sharyon Cable, MD by Era Bumpers, ED scribe. This patient was seen in room APA06/APA06 and the patient's care was started at 4:52 PM .    Chief Complaint  Patient presents with  . Abdominal Pain      HPI HPI Comments: Danielle Bonilla is a 43 y.o. female with a h/o hysterectomy who presents to the Emergency Department complaining of constant, gradually worsening, sharp lower abdominal pain that began a week ago. Pt also reports pain radiates down her lower back. She also reports suprapubic pressure while voiding urine. Pt denies fevers, vomiting, hematuria, dysuria, vaginal bleeding, and vaginal discharge. She also denies any h/o similar episodes until today.   Past Medical History  Diagnosis Date  . Bronchitis   . Asthma   . Anemia    Past Surgical History  Procedure Laterality Date  . Abdominal hysterectomy    . Tumor excision    . Abdominal hysterectomy     Family History  Problem Relation Age of Onset  . Cancer Father   . Diabetes Father    History  Substance Use Topics  . Smoking status: Current Every Day Smoker    Types: Cigarettes  . Smokeless tobacco: Not on file  . Alcohol Use: No   OB History   Grav Para Term Preterm Abortions TAB SAB Ect Mult Living   4 4 4             Review of Systems  Constitutional: Negative for fever and chills.  Respiratory: Negative for cough and shortness of breath.   Cardiovascular: Negative for chest pain.  Gastrointestinal: Positive for abdominal pain and diarrhea. Negative for vomiting and blood in stool.  Genitourinary: Negative for dysuria, hematuria, vaginal bleeding and vaginal discharge.  Musculoskeletal: Negative for back pain.  All other systems reviewed and are negative.      Allergies  Latex and Pineapple  Home Medications   Current Outpatient Rx  Name  Route  Sig  Dispense  Refill  .  aspirin-acetaminophen-caffeine (EXCEDRIN MIGRAINE) 250-250-65 MG per tablet   Oral   Take 2 tablets by mouth every 6 (six) hours as needed for pain.          Marland Kitchen azithromycin (ZITHROMAX Z-PAK) 250 MG tablet      Take 2 tablets PO today and then one tablet daily for infection   6 each   0   . ibuprofen (ADVIL,MOTRIN) 200 MG tablet   Oral   Take 400 mg by mouth every 8 (eight) hours as needed. migraines         . Phenyleph-Promethazine-Cod 5-6.25-10 MG/5ML SYRP   Oral   Take 5 mLs by mouth every 4 (four) hours as needed.   120 mL   0   . predniSONE (STERAPRED UNI-PAK) 10 MG tablet   Oral   Take 1 tablet (10 mg total) by mouth daily. Starting 09/16/2013 take 5 tablets PO then 4, 3, 2, 1   15 tablet   0    Triage Vitals: BP 114/91  Pulse 69  Temp(Src) 98.4 F (36.9 C) (Oral)  Resp 18  Ht 5\' 7"  (1.702 m)  Wt 240 lb (108.863 kg)  BMI 37.58 kg/m2  SpO2 100%  Physical Exam CONSTITUTIONAL: Well developed/well nourished HEAD: Normocephalic/atraumatic EYES: EOMI/PERRL, no icterus ENMT: Mucous membranes moist NECK: supple no meningeal signs SPINE:entire spine nontender CV: S1/S2 noted, no  murmurs/rubs/gallops noted LUNGS: Lungs are clear to auscultation bilaterally, no apparent distress ABDOMEN: soft, mild suprapubic tenderness, no rebound or guarding GU:no cva tenderness NEURO: Pt is awake/alert, moves all extremitiesx4 EXTREMITIES: pulses normal, full ROM SKIN: warm, color normal PSYCH: no abnormalities of mood noted  ED Course  Procedures   DIAGNOSTIC STUDIES: Oxygen Saturation is 100% on RA, normal by my interpretation.    COORDINATION OF CARE: At 4:55 PM Discussed treatment plan with patient which includes UA, labs, and pain medication. Patient agrees.    Pt improved, well appearing, no focal abd tenderness on repeat exam, she is interactive with family and no distress noted. My suspicion for acute abdominal process/appendicitis is low We discussed strict  return precautions Stable for d/c home  Labs Review Labs Reviewed  CBC WITH DIFFERENTIAL - Abnormal; Notable for the following:    Neutrophils Relative % 40 (*)    Lymphocytes Relative 48 (*)    Eosinophils Relative 7 (*)    All other components within normal limits  URINALYSIS, ROUTINE W REFLEX MICROSCOPIC  BASIC METABOLIC PANEL    MDM   Final diagnoses:  Abdominal pain    Nursing notes including past medical history and social history reviewed and considered in documentation Labs/vital reviewed and considered  I personally performed the services described in this documentation, which was scribed in my presence. The recorded information has been reviewed and is accurate.       Sharyon Cable, MD 01/09/14 2240

## 2014-01-14 ENCOUNTER — Emergency Department (HOSPITAL_COMMUNITY)
Admission: EM | Admit: 2014-01-14 | Discharge: 2014-01-14 | Disposition: A | Discharge status: Home / Self Care | Payer: Self-pay | Attending: Emergency Medicine | Admitting: Emergency Medicine

## 2014-01-14 ENCOUNTER — Emergency Department (HOSPITAL_COMMUNITY): Payer: Self-pay

## 2014-01-14 ENCOUNTER — Encounter (HOSPITAL_COMMUNITY): Payer: Self-pay | Admitting: Emergency Medicine

## 2014-01-14 DIAGNOSIS — R112 Nausea with vomiting, unspecified: Secondary | ICD-10-CM

## 2014-01-14 DIAGNOSIS — J111 Influenza due to unidentified influenza virus with other respiratory manifestations: Secondary | ICD-10-CM

## 2014-01-14 DIAGNOSIS — J45909 Unspecified asthma, uncomplicated: Secondary | ICD-10-CM | POA: Insufficient documentation

## 2014-01-14 DIAGNOSIS — R197 Diarrhea, unspecified: Secondary | ICD-10-CM

## 2014-01-14 DIAGNOSIS — Z862 Personal history of diseases of the blood and blood-forming organs and certain disorders involving the immune mechanism: Secondary | ICD-10-CM | POA: Insufficient documentation

## 2014-01-14 DIAGNOSIS — F17200 Nicotine dependence, unspecified, uncomplicated: Secondary | ICD-10-CM | POA: Insufficient documentation

## 2014-01-14 DIAGNOSIS — R69 Illness, unspecified: Secondary | ICD-10-CM

## 2014-01-14 DIAGNOSIS — E860 Dehydration: Secondary | ICD-10-CM

## 2014-01-14 DIAGNOSIS — Z91040 Latex allergy status: Secondary | ICD-10-CM | POA: Insufficient documentation

## 2014-01-14 LAB — CBC WITH DIFFERENTIAL/PLATELET
Basophils Absolute: 0 10*3/uL (ref 0.0–0.1)
Basophils Relative: 0 % (ref 0–1)
EOS ABS: 0.2 10*3/uL (ref 0.0–0.7)
EOS PCT: 4 % (ref 0–5)
HCT: 36.7 % (ref 36.0–46.0)
Hemoglobin: 12 g/dL (ref 12.0–15.0)
LYMPHS ABS: 1.4 10*3/uL (ref 0.7–4.0)
Lymphocytes Relative: 28 % (ref 12–46)
MCH: 28.4 pg (ref 26.0–34.0)
MCHC: 32.7 g/dL (ref 30.0–36.0)
MCV: 87 fL (ref 78.0–100.0)
Monocytes Absolute: 0.3 10*3/uL (ref 0.1–1.0)
Monocytes Relative: 7 % (ref 3–12)
Neutro Abs: 3.1 10*3/uL (ref 1.7–7.7)
Neutrophils Relative %: 61 % (ref 43–77)
Platelets: 299 10*3/uL (ref 150–400)
RBC: 4.22 MIL/uL (ref 3.87–5.11)
RDW: 14.5 % (ref 11.5–15.5)
WBC: 5 10*3/uL (ref 4.0–10.5)

## 2014-01-14 LAB — POC OCCULT BLOOD, ED: Fecal Occult Bld: NEGATIVE

## 2014-01-14 LAB — COMPREHENSIVE METABOLIC PANEL
ALK PHOS: 45 U/L (ref 39–117)
ALT: 11 U/L (ref 0–35)
AST: 13 U/L (ref 0–37)
Albumin: 3.6 g/dL (ref 3.5–5.2)
BUN: 8 mg/dL (ref 6–23)
CALCIUM: 8.9 mg/dL (ref 8.4–10.5)
CO2: 21 meq/L (ref 19–32)
Chloride: 103 mEq/L (ref 96–112)
Creatinine, Ser: 0.79 mg/dL (ref 0.50–1.10)
GLUCOSE: 99 mg/dL (ref 70–99)
POTASSIUM: 3.6 meq/L — AB (ref 3.7–5.3)
Sodium: 137 mEq/L (ref 137–147)
Total Bilirubin: 0.4 mg/dL (ref 0.3–1.2)
Total Protein: 7.5 g/dL (ref 6.0–8.3)

## 2014-01-14 MED ORDER — METOCLOPRAMIDE HCL 5 MG/ML IJ SOLN
10.0000 mg | Freq: Once | INTRAMUSCULAR | Status: AC
  Administered 2014-01-14: 10 mg via INTRAVENOUS
  Filled 2014-01-14: qty 2

## 2014-01-14 MED ORDER — ONDANSETRON HCL 4 MG PO TABS: 4.0000 mg | ORAL_TABLET | Freq: Three times a day (TID) | ORAL | Status: DC | PRN

## 2014-01-14 MED ORDER — SODIUM CHLORIDE 0.9 % IV SOLN
1000.0000 mL | INTRAVENOUS | Status: DC
  Administered 2014-01-14: 1000 mL via INTRAVENOUS

## 2014-01-14 MED ORDER — FENTANYL CITRATE 0.05 MG/ML IJ SOLN
25.0000 ug | Freq: Once | INTRAMUSCULAR | Status: AC
  Administered 2014-01-14: 25 ug via INTRAVENOUS
  Filled 2014-01-14: qty 2

## 2014-01-14 MED ORDER — SODIUM CHLORIDE 0.9 % IV SOLN
1000.0000 mL | Freq: Once | INTRAVENOUS | Status: AC
  Administered 2014-01-14: 1000 mL via INTRAVENOUS

## 2014-01-14 MED ORDER — SODIUM CHLORIDE 0.9 % IV BOLUS (SEPSIS): 1000.0000 mL | Freq: Once | INTRAVENOUS | Status: DC

## 2014-01-14 MED ORDER — KETOROLAC TROMETHAMINE 30 MG/ML IJ SOLN
30.0000 mg | Freq: Once | INTRAMUSCULAR | Status: AC
  Administered 2014-01-14: 30 mg via INTRAVENOUS
  Filled 2014-01-14: qty 1

## 2014-01-14 MED ORDER — DIPHENHYDRAMINE HCL 50 MG/ML IJ SOLN
25.0000 mg | Freq: Once | INTRAMUSCULAR | Status: AC
  Administered 2014-01-14: 25 mg via INTRAVENOUS
  Filled 2014-01-14: qty 1

## 2014-01-14 NOTE — ED Notes (Signed)
Abdominal pain , vomiting and diarrhea began last night.

## 2014-01-14 NOTE — Discharge Instructions (Signed)
Drink plenty of fluids. Avoid milk products until the diarrhea is gone. Use the zofran for nausea or vomiting. Take imodium OTC for diarrhea.  She can have ibuprofen 600 mg + acetaminophen 1000 mg 4 times a day for fever or body aches. You should be rechecked if you get dehydrated again, or if you struggle to breathe.     Nausea and Vomiting Nausea is a sick feeling that often comes before throwing up (vomiting). Vomiting is a reflex where stomach contents come out of your mouth. Vomiting can cause severe loss of body fluids (dehydration). Children and elderly adults can become dehydrated quickly, especially if they also have diarrhea. Nausea and vomiting are symptoms of a condition or disease. It is important to find the cause of your symptoms. CAUSES   Direct irritation of the stomach lining. This irritation can result from increased acid production (gastroesophageal reflux disease), infection, food poisoning, taking certain medicines (such as nonsteroidal anti-inflammatory drugs), alcohol use, or tobacco use.  Signals from the brain.These signals could be caused by a headache, heat exposure, an inner ear disturbance, increased pressure in the brain from injury, infection, a tumor, or a concussion, pain, emotional stimulus, or metabolic problems.  An obstruction in the gastrointestinal tract (bowel obstruction).  Illnesses such as diabetes, hepatitis, gallbladder problems, appendicitis, kidney problems, cancer, sepsis, atypical symptoms of a heart attack, or eating disorders.  Medical treatments such as chemotherapy and radiation.  Receiving medicine that makes you sleep (general anesthetic) during surgery. DIAGNOSIS Your caregiver may ask for tests to be done if the problems do not improve after a few days. Tests may also be done if symptoms are severe or if the reason for the nausea and vomiting is not clear. Tests may include:  Urine tests.  Blood tests.  Stool tests.  Cultures (to  look for evidence of infection).  X-rays or other imaging studies. Test results can help your caregiver make decisions about treatment or the need for additional tests. TREATMENT You need to stay well hydrated. Drink frequently but in small amounts.You may wish to drink water, sports drinks, clear broth, or eat frozen ice pops or gelatin dessert to help stay hydrated.When you eat, eating slowly may help prevent nausea.There are also some antinausea medicines that may help prevent nausea. HOME CARE INSTRUCTIONS   Take all medicine as directed by your caregiver.  If you do not have an appetite, do not force yourself to eat. However, you must continue to drink fluids.  If you have an appetite, eat a normal diet unless your caregiver tells you differently.  Eat a variety of complex carbohydrates (rice, wheat, potatoes, bread), lean meats, yogurt, fruits, and vegetables.  Avoid high-fat foods because they are more difficult to digest.  Drink enough water and fluids to keep your urine clear or pale yellow.  If you are dehydrated, ask your caregiver for specific rehydration instructions. Signs of dehydration may include:  Severe thirst.  Dry lips and mouth.  Dizziness.  Dark urine.  Decreasing urine frequency and amount.  Confusion.  Rapid breathing or pulse. SEEK IMMEDIATE MEDICAL CARE IF:   You have blood or brown flecks (like coffee grounds) in your vomit.  You have black or bloody stools.  You have a severe headache or stiff neck.  You are confused.  You have severe abdominal pain.  You have chest pain or trouble breathing.  You do not urinate at least once every 8 hours.  You develop cold or clammy skin.  You  continue to vomit for longer than 24 to 48 hours.  You have a fever. MAKE SURE YOU:   Understand these instructions.  Will watch your condition.  Will get help right away if you are not doing well or get worse. Document Released: 11/03/2005  Document Revised: 01/26/2012 Document Reviewed: 04/02/2011 [facility] Patient Information 2014 [place], [place].  Influenza, Adult Influenza ("the flu") is a viral infection of the respiratory tract. It occurs more often in winter months because people spend more time in close contact with one another. Influenza can make you feel very sick. Influenza easily spreads from person to person (contagious). CAUSES  Influenza is caused by a virus that infects the respiratory tract. You can catch the virus by breathing in droplets from an infected person's cough or sneeze. You can also catch the virus by touching something that was recently contaminated with the virus and then touching your mouth, nose, or eyes. SYMPTOMS  Symptoms typically last 4 to 10 days and may include:  Fever.  Chills.  Headache, body aches, and muscle aches.  Sore throat.  Chest discomfort and cough.  Poor appetite.  Weakness or feeling tired.  Dizziness.  Nausea or vomiting. DIAGNOSIS  Diagnosis of influenza is often made based on your history and a physical exam. A nose or throat swab test can be done to confirm the diagnosis. RISKS AND COMPLICATIONS You may be at risk for a more severe case of influenza if you smoke cigarettes, have diabetes, have chronic heart disease (such as heart failure) or lung disease (such as asthma), or if you have a weakened immune system. Elderly people and pregnant women are also at risk for more serious infections. The most common complication of influenza is a lung infection (pneumonia). Sometimes, this complication can require emergency medical care and may be life-threatening. PREVENTION  An annual influenza vaccination (flu shot) is the best way to avoid getting influenza. An annual flu shot is now routinely recommended for all adults in the [place] TREATMENT  In mild cases, influenza goes away on its own. Treatment is directed at relieving symptoms. For more severe cases, your caregiver  may prescribe antiviral medicines to shorten the sickness. Antibiotic medicines are not effective, because the infection is caused by a virus, not by bacteria. HOME CARE INSTRUCTIONS  Only take over-the-counter or prescription medicines for pain, discomfort, or fever as directed by your caregiver.  Use a cool mist humidifier to make breathing easier.  Get plenty of rest until your temperature returns to normal. This usually takes 3 to 4 days.  Drink enough fluids to keep your urine clear or pale yellow.  Cover your mouth and nose when coughing or sneezing, and wash your hands well to avoid spreading the virus.  Stay home from work or school until your fever has been gone for at least 1 full day. SEEK MEDICAL CARE IF:   You have chest pain or a deep cough that worsens or produces more mucus.  You have nausea, vomiting, or diarrhea. SEEK IMMEDIATE MEDICAL CARE IF:   You have difficulty breathing, shortness of breath, or your skin or nails turn bluish.  You have severe neck pain or stiffness.  You have a severe headache, facial pain, or earache.  You have a worsening or recurring fever.  You have nausea or vomiting that cannot be controlled. MAKE SURE YOU:  Understand these instructions.  Will watch your condition.  Will get help right away if you are not doing well or get worse.  Document Released: 10/31/2000 Document Revised: 05/04/2012 Document Reviewed: 02/02/2012 [facility] Patient Information 2014 [place], [place].

## 2014-01-14 NOTE — ED Notes (Signed)
C/o nausea, vomiting and diarrhea, fever, chills onset today

## 2014-01-14 NOTE — ED Provider Notes (Signed)
CSN: [id number]     Arrival date & time 01/14/14  1731 History   First MD Initiated Contact with Patient 01/14/14 1801     Chief Complaint  Patient presents with  . Abdominal Pain     (Consider location/radiation/quality/duration/timing/severity/associated sxs/prior Treatment) HPI Patient states yesterday she started getting diarrhea that she describes as watery with specks of blood. She states she has it every 10-15 minutes. She also has had nausea with vomiting x3 without blood. She states she was seen in the ED last week for some abdominal problems that had gotten better however it started returning again last night. The pain was diffuse last week. She denies known fevers however she is having chills. She has an occasional dry cough. She is starting to have some clear rhinorrhea and starting to get a sore throat. She has a diffuse headache. She denies any change in her vision. She states she feels weak and dizzy. She states her legs feel numb. She denies eating anything that could have made her ill. She denies being around any other known sick people.  Pt did not get flu shot this year.   PCP none  Past Medical History  Diagnosis Date  . Bronchitis   . Asthma   . Anemia    Past Surgical History  Procedure Laterality Date  . Abdominal hysterectomy    . Tumor excision    . Abdominal hysterectomy     Family History  Problem Relation Age of Onset  . Cancer Father   . Diabetes Father    History  Substance Use Topics  . Smoking status: Current Every Day Smoker    Types: Cigarettes  . Smokeless tobacco: Not on file  . 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      Allergies  Latex and Pineapple  Home Medications   Current Outpatient Rx  Name  Route  Sig   Dispense  Refill  . ondansetron (ZOFRAN) 4 MG tablet   Oral   Take 1 tablet (4 mg total) by mouth every 8 (eight) hours as needed for nausea or vomiting.   6 tablet   0    BP 124/64  Pulse 91  Temp(Src) 100.2 F (37.9 C) (Oral)  Resp 20  Ht 5\' 7"  (1.702 m)  Wt 240 lb (108.863 kg)  BMI 37.58 kg/m2  SpO2 99% except for low-grade fever  Vital signs normal   Physical Exam  Nursing note and vitals reviewed. Constitutional: She is oriented to person, place, and time. She appears well-developed and well-nourished.  Non-toxic appearance. She does not appear ill. No distress.  Looks like she feels bad  HENT:  Head: Normocephalic and atraumatic.  Right Ear: External ear normal.  Left Ear: External ear normal.  Nose: Nose normal. No mucosal edema or rhinorrhea.  Mouth/Throat: Oropharynx is clear and moist and mucous membranes are normal. No dental abscesses or uvula swelling.  Eyes: Conjunctivae and EOM are normal. Pupils are equal, round, and reactive to light.  Neck: Normal range of motion and full passive range of motion without pain. Neck supple.  Cardiovascular: Normal rate, regular rhythm and normal heart sounds.  Exam reveals no gallop and no friction rub.  No murmur heard. Pulmonary/Chest: Effort normal and breath sounds normal. No respiratory distress. She has no wheezes. She has no rhonchi. She has no rales. She exhibits no tenderness and no crepitus.  Abdominal: Soft. Normal appearance and bowel sounds are normal. She exhibits no distension. There is generalized tenderness. There is no rigidity, no rebound and no guarding.  Musculoskeletal: Normal range of motion. She exhibits no edema and no tenderness.  Moves all extremities well.   Neurological: She is alert and oriented to person, place, and time. She has normal strength. No cranial nerve deficit.  Skin: Skin is warm, dry and intact. No rash noted. No erythema. No pallor.  Psychiatric: She has a normal mood and affect.  Her speech is normal and behavior is normal. Her mood appears not anxious.    ED Course  Procedures (including critical care time)  Medications  0.9 %  sodium chloride infusion (0 mLs Intravenous Stopped 01/14/14 1940)    Followed by  0.9 %  sodium chloride infusion (1,000 mLs Intravenous New Bag/Given 01/14/14 1933)  sodium chloride 0.9 % bolus 1,000 mL (not administered)  ketorolac (TORADOL) 30 MG/ML injection 30 mg (30 mg Intravenous Given 01/14/14 1905)  fentaNYL (SUBLIMAZE) injection 25 mcg (25 mcg Intravenous Given 01/14/14 1904)  metoCLOPramide (REGLAN) injection 10 mg (10 mg Intravenous Given 01/14/14 1905)  diphenhydrAMINE (BENADRYL) injection 25 mg (25 mg Intravenous Given 01/14/14 1905)   Patient has had no diarrhea while in the ED. She's had no vomiting. We discussed her symptoms are most likely influenza-like illness. She can go home with supportive care.  Results for orders placed during the hospital encounter of 01/14/14  CBC WITH DIFFERENTIAL      Result Value Ref Range   WBC 5.0  4.0 - 10.5 K/uL   RBC 4.22  3.87 - 5.11 MIL/uL   Hemoglobin 12.0  12.0 - 15.0 g/dL   HCT 36.7  36.0 - 46.0 %   MCV 87.0  78.0 - 100.0 fL   MCH 28.4  26.0 - 34.0 pg   MCHC 32.7  30.0 - 36.0 g/dL   RDW 14.5  11.5 - 15.5 %   Platelets 299  150 - 400 K/uL   Neutrophils Relative % 61  43 - 77 %   Neutro Abs 3.1  1.7 - 7.7 K/uL   Lymphocytes Relative 28  12 - 46 %   Lymphs Abs 1.4  0.7 - 4.0 K/uL   Monocytes Relative 7  3 - 12 %   Monocytes Absolute 0.3  0.1 - 1.0 K/uL   Eosinophils Relative 4  0 - 5 %   Eosinophils Absolute 0.2  0.0 - 0.7 K/uL   Basophils Relative 0  0 - 1 %   Basophils Absolute 0.0  0.0 - 0.1 K/uL  COMPREHENSIVE METABOLIC PANEL      Result Value Ref Range   Sodium 137  137 - 147 mEq/L   Potassium 3.6 (*) 3.7 - 5.3 mEq/L   Chloride 103  96 - 112 mEq/L   CO2 21  19 - 32 mEq/L   Glucose, Bld 99  70 - 99 mg/dL   BUN 8  6 - 23 mg/dL   Creatinine, Ser 0.79  0.50 - 1.10 mg/dL    Calcium 8.9  8.4 - 10.5 mg/dL   Total Protein 7.5  6.0 - 8.3 g/dL   Albumin 3.6  3.5 - 5.2 g/dL   AST 13  0 - 37 U/L   ALT 11  0 -  35 U/L   Alkaline Phosphatase 45  39 - 117 U/L   Total Bilirubin 0.4  0.3 - 1.2 mg/dL   GFR calc non Af Amer >90  >90 mL/min   GFR calc Af Amer >90  >90 mL/min  POC OCCULT BLOOD, ED      Result Value Ref Range   Fecal Occult Bld NEGATIVE  NEGATIVE   Laboratory interpretation all normal    Labs Review Results for orders placed during the hospital encounter of 01/09/14  URINALYSIS, ROUTINE W REFLEX MICROSCOPIC      Result Value Ref Range   Color, Urine YELLOW  YELLOW   APPearance CLEAR  CLEAR   Specific Gravity, Urine 1.025  1.005 - 1.030   pH 6.0  5.0 - 8.0   Glucose, UA NEGATIVE  NEGATIVE mg/dL   Hgb urine dipstick NEGATIVE  NEGATIVE   Bilirubin Urine NEGATIVE  NEGATIVE   Ketones, ur NEGATIVE  NEGATIVE mg/dL   Protein, ur NEGATIVE  NEGATIVE mg/dL   Urobilinogen, UA 0.2  0.0 - 1.0 mg/dL   Nitrite NEGATIVE  NEGATIVE   Leukocytes, UA NEGATIVE  NEGATIVE  CBC WITH DIFFERENTIAL      Result Value Ref Range   WBC 8.3  4.0 - 10.5 K/uL   RBC 4.29  3.87 - 5.11 MIL/uL   Hemoglobin 12.1  12.0 - 15.0 g/dL   HCT 37.1  36.0 - 46.0 %   MCV 86.5  78.0 - 100.0 fL   MCH 28.2  26.0 - 34.0 pg   MCHC 32.6  30.0 - 36.0 g/dL   RDW 14.6  11.5 - 15.5 %   Platelets 316  150 - 400 K/uL   Neutrophils Relative % 40 (*) 43 - 77 %   Neutro Abs 3.3  1.7 - 7.7 K/uL   Lymphocytes Relative 48 (*) 12 - 46 %   Lymphs Abs 4.0  0.7 - 4.0 K/uL   Monocytes Relative 5  3 - 12 %   Monocytes Absolute 0.4  0.1 - 1.0 K/uL   Eosinophils Relative 7 (*) 0 - 5 %   Eosinophils Absolute 0.6  0.0 - 0.7 K/uL   Basophils Relative 1  0 - 1 %   Basophils Absolute 0.0  0.0 - 0.1 K/uL  BASIC METABOLIC PANEL      Result Value Ref Range   Sodium 139  137 - 147 mEq/L   Potassium 3.7  3.7 - 5.3 mEq/L   Chloride 101  96 - 112 mEq/L   CO2 25  19 - 32 mEq/L   Glucose, Bld 114 (*) 70 - 99 mg/dL    BUN 12  6 - 23 mg/dL   Creatinine, Ser 0.91  0.50 - 1.10 mg/dL   Calcium 9.8  8.4 - 10.5 mg/dL   GFR calc non Af Amer 76 (*) >90 mL/min   GFR calc Af Amer 88 (*) >90 mL/min       Imaging Review Dg Chest 2 View  01/14/2014   CLINICAL DATA:  Fever, chills, cough and diarrhea.  EXAM: CHEST  2 VIEW  COMPARISON:  09/15/2013 and 12/20/2011  FINDINGS: The heart size and mediastinal contours are within normal limits. Both lungs are clear. The visualized skeletal structures are unremarkable.  IMPRESSION: No active cardiopulmonary disease.   Electronically Signed   By: Marin Olp M.D.   On: 01/14/2014 19:59     EKG Interpretation None      MDM   Final diagnoses:  Influenza-like illness  Nausea  vomiting and diarrhea  Dehydration     New Prescriptions   ONDANSETRON (ZOFRAN) 4 MG TABLET    Take 1 tablet (4 mg total) by mouth every 8 (eight) hours as needed for nausea or vomiting.    Plan discharge   Rolland Porter, MD, Alanson Aly, MD 01/14/14 2147

## 2014-04-21 ENCOUNTER — Encounter (HOSPITAL_COMMUNITY): Payer: Self-pay | Admitting: Emergency Medicine

## 2014-04-21 ENCOUNTER — Emergency Department (HOSPITAL_COMMUNITY)
Admission: EM | Admit: 2014-04-21 | Discharge: 2014-04-21 | Disposition: A | Discharge status: Home / Self Care | Payer: Self-pay | Attending: Emergency Medicine | Admitting: Emergency Medicine

## 2014-04-21 DIAGNOSIS — Z862 Personal history of diseases of the blood and blood-forming organs and certain disorders involving the immune mechanism: Secondary | ICD-10-CM | POA: Insufficient documentation

## 2014-04-21 DIAGNOSIS — Z792 Long term (current) use of antibiotics: Secondary | ICD-10-CM | POA: Insufficient documentation

## 2014-04-21 DIAGNOSIS — Z91040 Latex allergy status: Secondary | ICD-10-CM | POA: Insufficient documentation

## 2014-04-21 DIAGNOSIS — J45901 Unspecified asthma with (acute) exacerbation: Secondary | ICD-10-CM | POA: Insufficient documentation

## 2014-04-21 DIAGNOSIS — K056 Periodontal disease, unspecified: Secondary | ICD-10-CM | POA: Insufficient documentation

## 2014-04-21 DIAGNOSIS — K029 Dental caries, unspecified: Secondary | ICD-10-CM | POA: Insufficient documentation

## 2014-04-21 DIAGNOSIS — K069 Disorder of gingiva and edentulous alveolar ridge, unspecified: Secondary | ICD-10-CM

## 2014-04-21 DIAGNOSIS — F17200 Nicotine dependence, unspecified, uncomplicated: Secondary | ICD-10-CM | POA: Insufficient documentation

## 2014-04-21 MED ORDER — CLINDAMYCIN HCL 150 MG PO CAPS
300.0000 mg | ORAL_CAPSULE | Freq: Once | ORAL | Status: AC
  Administered 2014-04-21: 300 mg via ORAL
  Filled 2014-04-21: qty 2

## 2014-04-21 MED ORDER — AMOXICILLIN 500 MG PO CAPS: 500.0000 mg | ORAL_CAPSULE | Freq: Three times a day (TID) | ORAL | Status: DC

## 2014-04-21 MED ORDER — ACETAMINOPHEN-CODEINE #3 300-30 MG PO TABS
2.0000 | ORAL_TABLET | Freq: Once | ORAL | Status: AC
  Administered 2014-04-21: 2 via ORAL
  Filled 2014-04-21: qty 2

## 2014-04-21 MED ORDER — ACETAMINOPHEN-CODEINE #3 300-30 MG PO TABS: 1.0000 | ORAL_TABLET | Freq: Four times a day (QID) | ORAL | Status: DC | PRN

## 2014-04-21 MED ORDER — IBUPROFEN 800 MG PO TABS
800.0000 mg | ORAL_TABLET | Freq: Once | ORAL | Status: AC
  Administered 2014-04-21: 800 mg via ORAL
  Filled 2014-04-21: qty 1

## 2014-04-21 MED ORDER — ONDANSETRON HCL 4 MG PO TABS
4.0000 mg | ORAL_TABLET | Freq: Once | ORAL | Status: AC
  Administered 2014-04-21: 4 mg via ORAL
  Filled 2014-04-21: qty 1

## 2014-04-21 NOTE — ED Provider Notes (Signed)
CSN: [id number]     Arrival date & time 04/21/14  1727 History   First MD Initiated Contact with Patient 04/21/14 1836     Chief Complaint  Patient presents with  . Facial Swelling     (Consider location/radiation/quality/duration/timing/severity/associated sxs/prior Treatment) HPI Comments: Patient is a 43 year old female who presents to the emergency department with complaint of pain and swelling of the gum and face on the right side. The patient stated that she noticed this on yesterday. Patient states she has had cavities on the right side for quite some time, but has not been able to see a dentist. On yesterday she noted first swelling of the gum, and then swelling of the lower face. She has pain that radiates up toward the ear. The patient denies any high fever. His been no difficulty swallowing or speaking. She has tried [employer] and Tylenol without any improvement.  The history is provided by the patient.    Past Medical History  Diagnosis Date  . Bronchitis   . Asthma   . Anemia    Past Surgical History  Procedure Laterality Date  . Abdominal hysterectomy    . Tumor excision    . Abdominal hysterectomy     Family History  Problem Relation Age of Onset  . Cancer Father   . Diabetes Father    History  Substance Use Topics  . Smoking status: Current Every Day Smoker -- 0.50 packs/day    Types: Cigarettes  . Smokeless tobacco: Not on file  . Alcohol Use: No   OB History   Grav Para Term Preterm Abortions TAB SAB Ect Mult Living   4 4 4             Review of Systems  Constitutional: Negative for activity change.       All ROS Neg except as noted in HPI  HENT: Positive for dental problem. Negative for nosebleeds.   Eyes: Negative for photophobia and discharge.  Respiratory: Positive for wheezing. Negative for cough and shortness of breath.   Cardiovascular: Negative for chest pain and palpitations.  Gastrointestinal: Negative for abdominal pain and blood in  stool.  Genitourinary: Negative for dysuria, frequency and hematuria.  Musculoskeletal: Negative for arthralgias, back pain and neck pain.  Skin: Negative.   Neurological: Negative for dizziness, seizures and speech difficulty.  Psychiatric/Behavioral: Negative for hallucinations and confusion.      Allergies  Latex and Pineapple  Home Medications   Prior to Admission medications   Medication Sig Start Date End Date Taking? Authorizing Provider  acetaminophen-codeine (TYLENOL #3) 300-30 MG per tablet Take 1-2 tablets by mouth every 6 (six) hours as needed. 04/21/14   Lenox Ahr, PA-C  amoxicillin (AMOXIL) 500 MG capsule Take 1 capsule (500 mg total) by mouth 3 (three) times daily. 04/21/14   Lenox Ahr, PA-C   BP 114/62  Pulse 80  Temp(Src) 98.6 F (37 C) (Oral)  Resp 20  Ht 5\' 7"  (1.702 m)  Wt 230 lb (104.327 kg)  BMI 36.01 kg/m2  SpO2 95% Physical Exam  Nursing note and vitals reviewed. Constitutional: She is oriented to person, place, and time. She appears well-developed and well-nourished.  Non-toxic appearance.  HENT:  Head: Normocephalic.  Right Ear: Tympanic membrane and external ear normal.  Left Ear: Tympanic membrane and external ear normal.  There are multiple dental caries present. There is swelling of the right lower jaw and gum. There is no visible abscess present. There is evidence of gum  disease present. The airway is patent. There is no swelling under the tongue. Speech is clear and understandable.  Eyes: EOM and lids are normal. Pupils are equal, round, and reactive to light.  Neck: Normal range of motion. Neck supple. Carotid bruit is not present.  Cardiovascular: Normal rate, regular rhythm, normal heart sounds, intact distal pulses and normal pulses.   Pulmonary/Chest: Breath sounds normal. No respiratory distress.  Abdominal: Soft. Bowel sounds are normal. There is no tenderness. There is no guarding.  Musculoskeletal: Normal range of motion.   Lymphadenopathy:       Head (right side): No submandibular adenopathy present.       Head (left side): No submandibular adenopathy present.    She has no cervical adenopathy.  Neurological: She is alert and oriented to person, place, and time. She has normal strength. No cranial nerve deficit or sensory deficit.  Skin: Skin is warm and dry.  Psychiatric: She has a normal mood and affect. Her speech is normal.    ED Course  Procedures (including critical care time) Labs Review Labs Reviewed - No data to display  Imaging Review No results found.   EKG Interpretation None      MDM Vital signs are well within normal limits. Pulse oximetry is 95% on room air. Within normal limits by my interpretation. Patient has multiple dental caries, with swelling of the gum and now the right lower jaw area. No evidence for Ludwig's Angina. The patient will be treated with Amoxil 500 mg and 600 mg of ibuprofen 3 times daily. Prescription for Tylenol codeine also given to the patient for more severe pain. Patient strongly encouraged to see a dentist as soon as possible.    Final diagnoses:  Dental caries    *I have reviewed nursing notes, vital signs, and all appropriate lab and imaging results for this patient.Lenox Ahr, PA-C 04/21/14 1900

## 2014-04-21 NOTE — Discharge Instructions (Signed)
It is important that you see a dentist as soon as possible. Please use Amoxil and 600 mg of ibuprofen 3 times daily with food. Use Tylenol codeine every 6 hours if needed for pain. This medication may cause drowsiness, please use with caution. Dental Caries  Dental caries (also called tooth decay) is the most common oral disease. It can occur at any age, but is more common in children and young adults.  HOW DENTAL CARIES DEVELOPS  The process of decay begins when bacteria and foods (particularly sugars and starches) combine in your mouth to produce plaque. Plaque is a substance that sticks to the hard, outer surface of a tooth (enamel). The bacteria in plaque produce acids that attack enamel. These acids may also attack the root surface of a tooth (cementum) if it is exposed. Repeated attacks dissolve these surfaces and create holes in the tooth (cavities). If left untreated, the acids destroy the other layers of the tooth.  RISK FACTORS  Frequent sipping of sugary beverages.   Frequent snacking on sugary and starchy foods, especially those that easily get stuck in the teeth.   Poor oral hygiene.   Dry mouth.   Substance abuse such as methamphetamine abuse.   Broken or poor-fitting dental restorations.   Eating disorders.   Gastroesophageal reflux disease (GERD).   Certain radiation treatments to the head and neck. SYMPTOMS In the early stages of dental caries, symptoms are seldom present. Sometimes white, chalky areas may be seen on the enamel or other tooth layers. In later stages, symptoms may include:  Pits and holes on the enamel.  Toothache after sweet, hot, or cold foods or drinks are consumed.  Pain around the tooth.  Swelling around the tooth. DIAGNOSIS  Most of the time, dental caries is detected during a regular dental checkup. A diagnosis is made after a thorough medical and dental history is taken and the surfaces of your teeth are checked for signs of dental  caries. Sometimes special instruments, such as lasers, are used to check for dental caries. Dental X-ray exams may be taken so that areas not visible to the eye (such as between the contact areas of the teeth) can be checked for cavities.  TREATMENT  If dental caries is in its early stages, it may be reversed with a fluoride treatment or an application of a remineralizing agent at the dental office. Thorough brushing and flossing at home is needed to aid these treatments. If it is in its later stages, treatment depends on the location and extent of tooth destruction:   If a small area of the tooth has been destroyed, the destroyed area will be removed and cavities will be filled with a material such as gold, silver amalgam, or composite resin.   If a large area of the tooth has been destroyed, the destroyed area will be removed and a cap (crown) will be fitted over the remaining tooth structure.   If the center part of the tooth (pulp) is affected, a procedure called a root canal will be needed before a filling or crown can be placed.   If most of the tooth has been destroyed, the tooth may need to be pulled (extracted). HOME CARE INSTRUCTIONS You can prevent, stop, or reverse dental caries at home by practicing good oral hygiene. Good oral hygiene includes:  Thoroughly cleaning your teeth at least twice a day with a toothbrush and dental floss.   Using a fluoride toothpaste. A fluoride mouth rinse may also be  used if recommended by your dentist or health care provider.   Restricting the amount of sugary and starchy foods and sugary liquids you consume.   Avoiding frequent snacking on these foods and sipping of these liquids.   Keeping regular visits with a dentist for checkups and cleanings. PREVENTION   Practice good oral hygiene.  Consider a dental sealant. A dental sealant is a coating material that is applied by your dentist to the pits and grooves of teeth. The sealant  prevents food from being trapped in them. It may protect the teeth for several years.  Ask about fluoride supplements if you live in a community without fluorinated water or with water that has a low fluoride content. Use fluoride supplements as directed by your dentist or health care provider.  Allow fluoride varnish applications to teeth if directed by your dentist or health care provider. Document Released: 07/26/2002 Document Revised: 07/06/2013 Document Reviewed: 11/05/2012 [facility] Patient Information 2014 [place].

## 2014-04-21 NOTE — ED Provider Notes (Signed)
Medical screening examination/treatment/procedure(s) were performed by non-physician practitioner and as supervising physician I was immediately available for consultation/collaboration.   EKG Interpretation None        Maudry Diego, MD 04/21/14 2311

## 2014-04-21 NOTE — ED Notes (Signed)
Pain rt lower jaw, with swelling , says she has a  "bad tooth" there.

## 2014-04-21 NOTE — ED Notes (Signed)
Swelling to R buccal mucosa noted yesterday.  Has used warm salt rinses, Alleve, ibuprofen, BC, Tylenol w/out relief.  Obvious swelling noted to R jaw.

## 2014-08-17 ENCOUNTER — Emergency Department (HOSPITAL_COMMUNITY): Payer: Self-pay

## 2014-08-17 ENCOUNTER — Encounter (HOSPITAL_COMMUNITY): Payer: Self-pay | Admitting: Emergency Medicine

## 2014-08-17 ENCOUNTER — Emergency Department (HOSPITAL_COMMUNITY)
Admission: EM | Admit: 2014-08-17 | Discharge: 2014-08-17 | Disposition: A | Discharge status: Home / Self Care | Payer: Self-pay | Attending: Emergency Medicine | Admitting: Emergency Medicine

## 2014-08-17 DIAGNOSIS — G8911 Acute pain due to trauma: Secondary | ICD-10-CM | POA: Insufficient documentation

## 2014-08-17 DIAGNOSIS — Z91040 Latex allergy status: Secondary | ICD-10-CM | POA: Insufficient documentation

## 2014-08-17 DIAGNOSIS — Z792 Long term (current) use of antibiotics: Secondary | ICD-10-CM | POA: Insufficient documentation

## 2014-08-17 DIAGNOSIS — M5430 Sciatica, unspecified side: Secondary | ICD-10-CM | POA: Insufficient documentation

## 2014-08-17 DIAGNOSIS — Z862 Personal history of diseases of the blood and blood-forming organs and certain disorders involving the immune mechanism: Secondary | ICD-10-CM | POA: Insufficient documentation

## 2014-08-17 DIAGNOSIS — J45909 Unspecified asthma, uncomplicated: Secondary | ICD-10-CM | POA: Insufficient documentation

## 2014-08-17 DIAGNOSIS — Z7952 Long term (current) use of systemic steroids: Secondary | ICD-10-CM | POA: Insufficient documentation

## 2014-08-17 DIAGNOSIS — Z720 Tobacco use: Secondary | ICD-10-CM | POA: Insufficient documentation

## 2014-08-17 DIAGNOSIS — M5432 Sciatica, left side: Secondary | ICD-10-CM

## 2014-08-17 MED ORDER — ONDANSETRON 8 MG PO TBDP
8.0000 mg | ORAL_TABLET | Freq: Once | ORAL | Status: AC
  Administered 2014-08-17: 8 mg via ORAL
  Filled 2014-08-17: qty 1

## 2014-08-17 MED ORDER — OXYCODONE-ACETAMINOPHEN 5-325 MG PO TABS: 1.0000 | ORAL_TABLET | ORAL | Status: DC | PRN

## 2014-08-17 MED ORDER — HYDROMORPHONE HCL 1 MG/ML IJ SOLN
1.0000 mg | Freq: Once | INTRAMUSCULAR | Status: AC
  Administered 2014-08-17: 1 mg via INTRAMUSCULAR
  Filled 2014-08-17: qty 1

## 2014-08-17 MED ORDER — PREDNISONE 10 MG PO TABS: ORAL_TABLET | ORAL | Status: DC

## 2014-08-17 MED ORDER — CYCLOBENZAPRINE HCL 5 MG PO TABS: 5.0000 mg | ORAL_TABLET | Freq: Three times a day (TID) | ORAL | Status: DC | PRN

## 2014-08-17 MED ORDER — PREDNISONE 50 MG PO TABS
60.0000 mg | ORAL_TABLET | Freq: Once | ORAL | Status: AC
  Administered 2014-08-17: 60 mg via ORAL
  Filled 2014-08-17 (×2): qty 1

## 2014-08-17 NOTE — ED Notes (Signed)
Up to BR with assistance. 

## 2014-08-17 NOTE — ED Notes (Signed)
Patient states lower middle back pain, and pain that radiates into left leg down to toes. Patient is A&OX4, ambulatory without deficit at this time.

## 2014-08-17 NOTE — ED Notes (Signed)
Pain low back and inguinal area with radiation down lt leg, No known injury, No urinary sx.

## 2014-08-17 NOTE — Discharge Instructions (Signed)
Chronic Back Pain ° When back pain lasts longer than 3 months, it is called chronic back pain. People with chronic back pain often go through certain periods that are more intense (flare-ups).  °CAUSES °Chronic back pain can be caused by wear and tear (degeneration) on different structures in your back. These structures include: °· The bones of your spine (vertebrae) and the joints surrounding your spinal cord and nerve roots (facets). °· The strong, fibrous tissues that connect your vertebrae (ligaments). °Degeneration of these structures may result in pressure on your nerves. This can lead to constant pain. °HOME CARE INSTRUCTIONS °· Avoid bending, heavy lifting, prolonged sitting, and activities which make the problem worse. °· Take brief periods of rest throughout the day to reduce your pain. Lying down or standing usually is better than sitting while you are resting. °· Take over-the-counter or prescription medicines only as directed by your caregiver. °SEEK IMMEDIATE MEDICAL CARE IF:  °· You have weakness or numbness in one of your legs or feet. °· You have trouble controlling your bladder or bowels. °· You have nausea, vomiting, abdominal pain, shortness of breath, or fainting. °Document Released: 12/11/2004 Document Revised: 01/26/2012 Document Reviewed: 10/18/2011 °ExitCare® Patient Information ©2015 [employer]. This information is not intended to replace advice given to you by your health care provider. Make sure you discuss any questions you have with your health care provider. ° ° °Emergency Department Resource Guide °1) Find a Doctor and Pay Out of Pocket °Although you won't have to find out who is covered by your insurance plan, it is a good idea to ask around and get recommendations. You will then need to call the office and see if the doctor you have chosen will accept you as a new patient and what types of options they offer for patients who are self-pay. Some doctors offer discounts or will set  up payment plans for their patients who do not have insurance, but you will need to ask so you aren't surprised when you get to your appointment. ° °2) Contact Your Local Health Department °Not all health departments have doctors that can see patients for sick visits, but many do, so it is worth a call to see if yours does. If you don't know where your local health department is, you can check in your phone book. The CDC also has a tool to help you locate your state's health department, and many state websites also have listings of all of their local health departments. ° °3) Find a Walk-in Clinic °If your illness is not likely to be very severe or complicated, you may want to try a walk in clinic. These are popping up all over the country in pharmacies, drugstores, and shopping centers. They're usually staffed by nurse practitioners or physician assistants that have been trained to treat common illnesses and complaints. They're usually fairly quick and inexpensive. However, if you have serious medical issues or chronic medical problems, these are probably not your best option. ° °No Primary Care Doctor: °- Call [facility] at  832-8000 - they can help you locate a primary care doctor that  accepts your insurance, provides certain services, etc. °- Physician Referral Service- 1-800-533-3463 ° °Chronic Pain Problems: °Organization         Address  Phone   Notes  °[facility]  (336) 297-2271 Patients need to be referred by their primary care doctor.  ° °Medication Assistance: °Organization         Address    Phone   Notes  °[place] [facility] [address] °[place], [place] [address] (336) 641-8030 --Must be a resident of [place] °-- Must have NO insurance coverage whatsoever (no Medicaid/ Medicare, etc.) °-- The pt. MUST have a primary care doctor that directs their care regularly and follows them in the community °  °[facility]  (866) 331-1348    °[employer]  (888) 892-1162   ° °Agencies that provide inexpensive medical care: °Organization         Address  Phone   Notes  °[facility]  (336) 832-8035   °[facility]    (336) 832-7272   °[facility] [address] °[place], [place] [address] (336) 832-4777   °[facility] [address], °[place] (336) 271-4999   °[facility]    (336) 373-0678   °[facility]    (336) 272-1050   °[facility] ° [address], [place] Phone:  (336) 832-4444, Fax:  (336) 832-4440 Hours of Operation:  9 am - 6 pm, M-F.  Also accepts Medicaid/Medicare and self-pay.  °[facility] ° [address], [place] Phone: (336) 832-3150, Fax: (336) 832-3151. Hours of Operation:  8:30 am - 5:30 pm, M-F.  Also accepts Medicaid and self-pay.  °[facility] [place] [address], [place] Phone: (336) 878-6027   °[facility] [address], [place], [place] (336)723-1848, Ext. 123 Mondays & Thursdays: 7-9 AM.  First 15 patients are seen on a first come, first serve basis. °  ° °Medicaid-accepting [place] Providers: ° °Organization         Address  Phone   Notes  °[facility] [address], [place] (336) 641-2100 Also accepts self-pay patients.  °[facility] [address], [place] ° (336) 856-9996   °[facility] [address], [place] (336) 288-8857   °[facility] [address] [place] [address], [place] (336) 299-7000   °Veita Bland [address], [place]  ° (336) 373-1557 Only accepts [place] Access Medicaid patients after they have their name applied to their card.  ° °Self-Pay (no insurance) in [place]: ° °Organization         Address  Phone   Notes  °Sickle Cell Patients, [facility] [address],  [place] (336) 832-1970   °[facility] [address], [place] (336) 832-4400   °[facility] ° [address],  (336) 992-4800   °[facility]/Dr. Osei-Bonsu ° [address] [place] [address], [place] or [address], [place] (336) 841-8500 Phone number for both [place] and [place] locations is the same.  °[facility] [address], [place] (336) 299-0000   °[facility] [address] [place] [address], [place] or [address] (336) 852-7530 °(336) 878-2260   °[facility] [address], [place] (336) 350-1642, phone; (336) 294-5005, fax Sees patients 1st and 3rd Saturday of every month.  Must not qualify for public or private insurance (i.e. Medicaid, Medicare, [place] Health Choice, Veterans' Benefits) • Household income should be no more than 200% of the poverty level •The clinic cannot treat you if you are pregnant or think you are pregnant •   Sexually transmitted diseases are not treated at the clinic.  ° ° °Dental Care: °Organization         Address  Phone  Notes  °[facility] [address], [place] (336) 641-6152 Accepts children up to age 21 who are enrolled in Medicaid or [place] Health Choice; pregnant women with a Medicaid card; and children who have applied for Medicaid or [place] Health Choice, but were declined, whose parents can pay a reduced fee at time of service.  °[facility] [place]  [address], [place] (336) 641-7733 Accepts children up to age 21 who are enrolled in Medicaid or [place] Health Choice; pregnant women with a Medicaid card; and children who have applied for Medicaid or [address] Health Choice, but were declined, whose parents can pay a reduced fee at time of service.  °[facility] ° [address], [place] (336)  641-4533 Patients are seen by appointment only. Walk-ins are not accepted. [facility] will see patients 18 years of age and older. °Monday - Tuesday (8am-5pm) °Most Wednesdays (8:30-5pm) °$30 per visit, cash only  °[facility] ° [address], [place] (336) 641-4533 Patients are seen by appointment only. Walk-ins are not accepted. [facility] will see patients 18 years of age and older. °One Wednesday Evening (Monthly: Volunteer Based).  $30 per visit, cash only  °[facility]  (919) 537-3737 for adults; Children under age 4, call Graduate Pediatric Dentistry at (919) [phone]. Children aged 4-14, please call (919) [phone] to request a pediatric application. ° Dental services are provided in all areas of dental care including fillings, crowns and bridges, complete and partial dentures, implants, gum treatment, root canals, and extractions. Preventive care is also provided. Treatment is provided to both adults and children. °Patients are selected via a lottery and there is often a waiting list. °  °[facility] [address], °[place] ° (336) 763-8833 [email or web address] °  °[facility] [address], [place], [place] (336)723-1848, Ext. 123 Second and Fourth Thursday of each month, opens at 6:30 AM; Clinic ends at 9 AM.  Patients are seen on a first-come first-served basis, and a limited number are seen during each clinic.  ° °[facility] ° [address], [place], [place] (336) 723-7904   Eligibility Requirements °You must have lived in [place], [place], or [place] counties for at least the last three months. °  You cannot be eligible for state or federal sponsored healthcare insurance, including Veterans Administration, Medicaid, or Medicare. °  You generally cannot be eligible for healthcare insurance through your employer.  °  How to apply: °Eligibility screenings are held every Tuesday and Wednesday afternoon  from 1:00 pm until 4:00 pm. You do not need an appointment for the interview!  °[facility] [address], [place], [place] 336-631-2330   °[facility]  336-342-8273   °[facility]  336-703-3100   °[facility]  336-570-6415   ° °Behavioral Health Resources in the Community: °Intensive Outpatient Programs °Organization         Address  Phone  Notes  °[facility] [address], [place], [place] 336-878-6098   °[facility] [address], [place], [place] 336-832-9800   °[facility] [address], [place],  °   336-882-2125   °[facility] [address],  °[place], [place] 1-800-853-5163 or 336-641-4981   °Substance Abuse Resources °Organization         Address  Phone  Notes  °[facility]  336-882-2125   °[facility]  336-784-9470   °[facility]  336-285-9073   °[facility]  336-845-3988   °Residential & Outpatient Substance Abuse Program  1-800-659-3381   °Psychological Services °Organization         Address  Phone  Notes  °[facility]  336- 832-9600   °[facility]  336- 378-7881   °[facility] [address], [place] 1-800-853-5163 or 336-641-4981   ° °Mobile Crisis Teams °Organization         Address  Phone  Notes  °[facility], Mobile Crisis Care Unit  1-877-626-1772   °Assertive °[facility] ° [address] [place], [place] 336-834-9664   °Sharon DeEsch [address] °[place] 336-554-5454   ° °Self-Help/Support Groups °Organization         Address  Phone             Notes  °[facility] [place] - variety of support groups  336- 373-1402 Call for more information  °Narcotics Anonymous (NA), [facility] [address], °[place]  2 meetings at this location  ° °Residential Treatment  Programs °Organization         Address  Phone  Notes  °[facility] [address],    °[place]  1-866-801-8205   °[facility] ° [address], [place], [place] 704-293-8524   °[facility] [address], [place] 336-845-3988 Admissions: 8am-3pm M-F  °[facility] [address]    °[place], [place] 336-841-1104   °[facility] [address], [place], [place] 336-379-7146   °[facility] [address]  °[place], [place] 336-285-9073   °[facility] - Intensive Outpatient [address], [place], [place] 336-852-3033   °[facility] [address]  °[place], [place] 1-877-615-2722 or 336-784-9470   °[facility] [address] [place], [place] 336-227-7417 Accepts Medicaid  °[facility] [address]  °[place] 1-800-659-3381 Substance Abuse/Addiction Treatment  ° °[place] Behavioral Health Resources °Organization         Address  Phone  Notes  °[facility]  (888) 581-9988   °Myisha Pickerel Brannon, PhD [address] [place], [place]   (336) 349-5553 or (336) 951-0000   °[facility]   [address] °[place], [place] (336) 349-4454   °[facility] [address], [place], [place] (336) 342-8316 Insurance/Medicaid/sponsorship through [facility]  °[facility] [address]                                    [place], [place] (336) 342-8316 Therapy/tele-psych/case  °[facility] [address]  ° [place], [place] (336) 349-2233    °Dr. Arfeen  (336) 349-4544   °[facility] [place]  [employer] [place] [facility] 1) [address], [place] °2) [address], [place] °3)  [address], [place] (336) 349-3220 °(336) 342-7768 ° °(336) 342-8140   °[place] [facility] (336) 342-1394 or (336) 342-3537 (  After Hours)         Take  your next dose of prednisone tomorrow evening.  Use the the other medicines as directed.  Do not drive within 4 hours of taking oxycodone as this will make you drowsy.  Avoid lifting,  Bending,  Twisting or any other activity that worsens your pain over the next week.  Apply an  icepack  to your lower back for 10-15 minutes every 2 hours for the next 2 days.  You should get rechecked if your symptoms are not better over the next 5 days,  Or you develop increased pain,  Weakness in your leg(s) or loss of bladder or bowel function - these are symptoms of a worse injury.

## 2014-08-17 NOTE — ED Notes (Signed)
Patient denies injury

## 2014-08-19 NOTE — ED Provider Notes (Signed)
CSN: [id number]     Arrival date & time 08/17/14  1915 History   First MD Initiated Contact with Patient 08/17/14 2011     Chief Complaint  Patient presents with  . Back Pain     (Consider location/radiation/quality/duration/timing/severity/associated sxs/prior Treatment) The history is provided by the patient.    Danielle Bonilla is a 43 y.o. female presenting with acute on intermittently chronic low back pain which has which has been worsened for the past 4 days.   Patient denies any new injury specifically but has had pain intermittently since involved in a work related back injury about 5 years ago.  There is radiation of pain through her left leg to her heel which is new with this episode.  There has been no weakness or numbness in the lower extremities and no urinary or bowel retention or incontinence.  Patient does not have a history of cancer or IVDU.  The patient has tried the followed medicines and/or treatments without relief of pain: tylenol.     Past Medical History  Diagnosis Date  . Bronchitis   . Asthma   . Anemia    Past Surgical History  Procedure Laterality Date  . Abdominal hysterectomy    . Tumor excision    . Abdominal hysterectomy     Family History  Problem Relation Age of Onset  . Cancer Father   . Diabetes Father    History  Substance Use Topics  . Smoking status: Current Every Day Smoker -- 0.50 packs/day    Types: Cigarettes  . Smokeless tobacco: Not on file  . Alcohol Use: No   OB History   Grav Para Term Preterm Abortions TAB SAB Ect Mult Living   4 4 4             Review of Systems  Constitutional: Negative for fever.  Respiratory: Negative for shortness of breath.   Cardiovascular: Negative for chest pain and leg swelling.  Gastrointestinal: Negative for abdominal pain, constipation and abdominal distention.  Genitourinary: Negative for dysuria, urgency, frequency, flank pain and difficulty urinating.  Musculoskeletal: Positive for  back pain. Negative for gait problem and joint swelling.  Skin: Negative for rash.  Neurological: Negative for weakness and numbness.      Allergies  Latex and Pineapple  Home Medications   Prior to Admission medications   Medication Sig Start Date End Date Taking? Authorizing Provider  acetaminophen-codeine (TYLENOL #3) 300-30 MG per tablet Take 1-2 tablets by mouth every 6 (six) hours as needed. 04/21/14   Lenox Ahr, PA-C  amoxicillin (AMOXIL) 500 MG capsule Take 1 capsule (500 mg total) by mouth 3 (three) times daily. 04/21/14   Lenox Ahr, PA-C  cyclobenzaprine (FLEXERIL) 5 MG tablet Take 1 tablet (5 mg total) by mouth 3 (three) times daily as needed for muscle spasms. 08/17/14   Evalee Jefferson, PA-C  oxyCODONE-acetaminophen (PERCOCET/ROXICET) 5-325 MG per tablet Take 1 tablet by mouth every 4 (four) hours as needed. 08/17/14   Evalee Jefferson, PA-C  predniSONE (DELTASONE) 10 MG tablet 6, 5, 4, 3, 2 then 1 tablet by mouth daily for 6 days total. 08/17/14   Evalee Jefferson, PA-C   BP 117/63  Pulse 78  Temp(Src) 99 F (37.2 C) (Oral)  Resp 18  Ht 5\' 7"  (1.702 m)  Wt 216 lb (97.977 kg)  BMI 33.82 kg/m2  SpO2 100% Physical Exam  Nursing note and vitals reviewed. Constitutional: She appears well-developed and well-nourished.  HENT:  Head: Normocephalic.  Eyes: Conjunctivae are normal.  Neck: Normal range of motion. Neck supple.  Cardiovascular: Normal rate and intact distal pulses.   Pedal pulses normal.  Pulmonary/Chest: Effort normal.  Abdominal: Soft. Bowel sounds are normal. She exhibits no distension and no mass.  Musculoskeletal: Normal range of motion. She exhibits no edema.       Lumbar back: She exhibits tenderness. She exhibits no swelling, no edema and no spasm.  Neurological: She is alert. She has normal strength. She displays no atrophy and no tremor. No sensory deficit. Gait normal.  Reflex Scores:      Patellar reflexes are 2+ on the right side and 2+ on the left  side.      Achilles reflexes are 2+ on the right side and 2+ on the left side. No strength deficit noted in hip and knee flexor and extensor muscle groups.  Ankle flexion and extension intact.  Skin: Skin is warm and dry.  Psychiatric: She has a normal mood and affect.    ED Course  Procedures (including critical care time) Labs Review Labs Reviewed - No data to display  Imaging Review Dg Lumbar Spine Complete  08/17/2014   CLINICAL DATA:  Back pain  EXAM: LUMBAR SPINE - COMPLETE 4+ VIEW  COMPARISON:  CT abdomen pelvis dated 10/25/2012  FINDINGS: Five lumbar type vertebral bodies.  Normal lumbar lordosis.  No evidence of fracture or dislocation. Vertebral body heights and intervertebral disc spaces are maintained.  Mild degenerative changes at L5-S1.  Visualized bony pelvis appears intact.  IMPRESSION: No fracture or dislocation is seen.  Mild degenerative changes at L5-S1.   Electronically Signed   By: Julian Hy M.D.   On: 08/17/2014 21:31     EKG Interpretation None      MDM   Final diagnoses:  Sciatica, left    Patients labs and/or radiological studies were viewed and considered during the medical decision making and disposition process. Pt placed on oxycodone, flexeril, prednisone taper. Activity as tolerated, heat tx.  Advised she may need further imaging if sx persist or worsen.  Referrals given for obtaining pcp.  No neuro deficit on exam or by history to suggest emergent or surgical presentation.  Also discussed worsened sx that should prompt immediate re-evaluation including distal weakness, bowel/bladder retention/incontinence.          Evalee Jefferson, PA-C 08/19/14 2057

## 2014-08-21 NOTE — ED Provider Notes (Signed)
Medical screening examination/treatment/procedure(s) were performed by non-physician practitioner and as supervising physician I was immediately available for consultation/collaboration.   EKG Interpretation None      Rolland Porter, MD, Abram Sander   Janice Norrie, MD 08/21/14 2226

## 2014-09-18 ENCOUNTER — Encounter (HOSPITAL_COMMUNITY): Payer: Self-pay | Admitting: Emergency Medicine

## 2015-02-14 ENCOUNTER — Emergency Department (HOSPITAL_COMMUNITY)
Admission: EM | Admit: 2015-02-14 | Discharge: 2015-02-14 | Disposition: A | Discharge status: Home / Self Care | Payer: PRIVATE HEALTH INSURANCE | Attending: Emergency Medicine | Admitting: Emergency Medicine

## 2015-02-14 ENCOUNTER — Encounter (HOSPITAL_COMMUNITY): Payer: Self-pay | Admitting: Emergency Medicine

## 2015-02-14 DIAGNOSIS — S50871A Other superficial bite of right forearm, initial encounter: Secondary | ICD-10-CM | POA: Insufficient documentation

## 2015-02-14 DIAGNOSIS — Z79899 Other long term (current) drug therapy: Secondary | ICD-10-CM | POA: Insufficient documentation

## 2015-02-14 DIAGNOSIS — Y9389 Activity, other specified: Secondary | ICD-10-CM | POA: Insufficient documentation

## 2015-02-14 DIAGNOSIS — Y9289 Other specified places as the place of occurrence of the external cause: Secondary | ICD-10-CM | POA: Diagnosis not present

## 2015-02-14 DIAGNOSIS — J45909 Unspecified asthma, uncomplicated: Secondary | ICD-10-CM | POA: Insufficient documentation

## 2015-02-14 DIAGNOSIS — Z91040 Latex allergy status: Secondary | ICD-10-CM | POA: Diagnosis not present

## 2015-02-14 DIAGNOSIS — S51851A Open bite of right forearm, initial encounter: Secondary | ICD-10-CM | POA: Diagnosis present

## 2015-02-14 DIAGNOSIS — Z720 Tobacco use: Secondary | ICD-10-CM | POA: Diagnosis not present

## 2015-02-14 DIAGNOSIS — Z862 Personal history of diseases of the blood and blood-forming organs and certain disorders involving the immune mechanism: Secondary | ICD-10-CM | POA: Insufficient documentation

## 2015-02-14 DIAGNOSIS — Y998 Other external cause status: Secondary | ICD-10-CM | POA: Insufficient documentation

## 2015-02-14 MED ORDER — AMOXICILLIN-POT CLAVULANATE 875-125 MG PO TABS: 1.0000 | ORAL_TABLET | Freq: Two times a day (BID) | ORAL | Status: DC

## 2015-02-14 MED ORDER — DIPHENHYDRAMINE HCL 25 MG PO CAPS
25.0000 mg | ORAL_CAPSULE | Freq: Once | ORAL | Status: AC
  Administered 2015-02-14: 25 mg via ORAL

## 2015-02-14 MED ORDER — DIPHENHYDRAMINE HCL 25 MG PO CAPS
ORAL_CAPSULE | ORAL | Status: AC
  Filled 2015-02-14: qty 1

## 2015-02-14 MED ORDER — AMOXICILLIN-POT CLAVULANATE 875-125 MG PO TABS
1.0000 | ORAL_TABLET | Freq: Once | ORAL | Status: AC
  Administered 2015-02-14: 1 via ORAL
  Filled 2015-02-14: qty 1

## 2015-02-14 MED ORDER — BACITRACIN-NEOMYCIN-POLYMYXIN 400-5-5000 EX OINT
TOPICAL_OINTMENT | CUTANEOUS | Status: AC
  Administered 2015-02-14: 1
  Filled 2015-02-14: qty 1

## 2015-02-14 MED ORDER — IBUPROFEN 800 MG PO TABS
800.0000 mg | ORAL_TABLET | Freq: Once | ORAL | Status: AC
  Administered 2015-02-14: 800 mg via ORAL
  Filled 2015-02-14: qty 1

## 2015-02-14 MED ORDER — IBUPROFEN 800 MG PO TABS: 800.0000 mg | ORAL_TABLET | Freq: Three times a day (TID) | ORAL | Status: DC
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
## 2015-02-26 ENCOUNTER — Emergency Department (HOSPITAL_COMMUNITY)
Admission: EM | Admit: 2015-02-26 | Discharge: 2015-02-26 | Disposition: A | Discharge status: Home / Self Care | Payer: Self-pay | Attending: Emergency Medicine | Admitting: Emergency Medicine

## 2015-02-26 ENCOUNTER — Encounter (HOSPITAL_COMMUNITY): Payer: Self-pay | Admitting: Emergency Medicine

## 2015-02-26 ENCOUNTER — Emergency Department (HOSPITAL_COMMUNITY): Payer: Self-pay

## 2015-02-26 DIAGNOSIS — Z91040 Latex allergy status: Secondary | ICD-10-CM | POA: Insufficient documentation

## 2015-02-26 DIAGNOSIS — Z79899 Other long term (current) drug therapy: Secondary | ICD-10-CM | POA: Insufficient documentation

## 2015-02-26 DIAGNOSIS — R109 Unspecified abdominal pain: Secondary | ICD-10-CM

## 2015-02-26 DIAGNOSIS — Z8742 Personal history of other diseases of the female genital tract: Secondary | ICD-10-CM | POA: Insufficient documentation

## 2015-02-26 DIAGNOSIS — J45909 Unspecified asthma, uncomplicated: Secondary | ICD-10-CM | POA: Insufficient documentation

## 2015-02-26 DIAGNOSIS — N8320 Unspecified ovarian cysts: Secondary | ICD-10-CM | POA: Insufficient documentation

## 2015-02-26 DIAGNOSIS — Z90710 Acquired absence of both cervix and uterus: Secondary | ICD-10-CM | POA: Insufficient documentation

## 2015-02-26 DIAGNOSIS — Z792 Long term (current) use of antibiotics: Secondary | ICD-10-CM | POA: Insufficient documentation

## 2015-02-26 DIAGNOSIS — N83202 Unspecified ovarian cyst, left side: Secondary | ICD-10-CM

## 2015-02-26 DIAGNOSIS — Z862 Personal history of diseases of the blood and blood-forming organs and certain disorders involving the immune mechanism: Secondary | ICD-10-CM | POA: Insufficient documentation

## 2015-02-26 DIAGNOSIS — Z720 Tobacco use: Secondary | ICD-10-CM | POA: Insufficient documentation

## 2015-02-26 DIAGNOSIS — Z7952 Long term (current) use of systemic steroids: Secondary | ICD-10-CM | POA: Insufficient documentation

## 2015-02-26 HISTORY — DX: Excessive and frequent menstruation with regular cycle: N92.0

## 2015-02-26 HISTORY — DX: Leiomyoma of uterus, unspecified: D25.9

## 2015-02-26 LAB — CBC WITH DIFFERENTIAL/PLATELET
Basophils Absolute: 0.1 10*3/uL (ref 0.0–0.1)
Basophils Relative: 1 % (ref 0–1)
EOS ABS: 0.6 10*3/uL (ref 0.0–0.7)
EOS PCT: 6 % — AB (ref 0–5)
HCT: 37.7 % (ref 36.0–46.0)
Hemoglobin: 12.1 g/dL (ref 12.0–15.0)
Lymphocytes Relative: 40 % (ref 12–46)
Lymphs Abs: 3.8 10*3/uL (ref 0.7–4.0)
MCH: 28.2 pg (ref 26.0–34.0)
MCHC: 32.1 g/dL (ref 30.0–36.0)
MCV: 87.9 fL (ref 78.0–100.0)
Monocytes Absolute: 0.5 10*3/uL (ref 0.1–1.0)
Monocytes Relative: 5 % (ref 3–12)
Neutro Abs: 4.6 10*3/uL (ref 1.7–7.7)
Neutrophils Relative %: 48 % (ref 43–77)
Platelets: 321 10*3/uL (ref 150–400)
RBC: 4.29 MIL/uL (ref 3.87–5.11)
RDW: 14.2 % (ref 11.5–15.5)
WBC: 9.5 10*3/uL (ref 4.0–10.5)

## 2015-02-26 LAB — COMPREHENSIVE METABOLIC PANEL
ALT: 12 U/L (ref 0–35)
AST: 17 U/L (ref 0–37)
Albumin: 4.1 g/dL (ref 3.5–5.2)
Alkaline Phosphatase: 36 U/L — ABNORMAL LOW (ref 39–117)
Anion gap: 9 (ref 5–15)
BUN: 10 mg/dL (ref 6–23)
CO2: 24 mmol/L (ref 19–32)
Calcium: 9.3 mg/dL (ref 8.4–10.5)
Chloride: 106 mmol/L (ref 96–112)
Creatinine, Ser: 0.78 mg/dL (ref 0.50–1.10)
GFR calc Af Amer: >90 mL/min (ref >90)
Glucose, Bld: 88 mg/dL (ref 70–99)
Potassium: 3.8 mmol/L (ref 3.5–5.1)
Sodium: 139 mmol/L (ref 135–145)
TOTAL PROTEIN: 7.3 g/dL (ref 6.0–8.3)
Total Bilirubin: 0.6 mg/dL (ref 0.3–1.2)

## 2015-02-26 LAB — URINALYSIS, ROUTINE W REFLEX MICROSCOPIC
Bilirubin Urine: NEGATIVE
Glucose, UA: NEGATIVE mg/dL
Hgb urine dipstick: NEGATIVE
Ketones, ur: NEGATIVE mg/dL
Leukocytes, UA: NEGATIVE
Nitrite: NEGATIVE
PROTEIN: NEGATIVE mg/dL
Specific Gravity, Urine: 1.025 (ref 1.005–1.030)
UROBILINOGEN UA: 0.2 mg/dL (ref 0.0–1.0)
pH: 5.5 (ref 5.0–8.0)

## 2015-02-26 LAB — LIPASE, BLOOD: Lipase: 28 U/L (ref 11–59)

## 2015-02-26 MED ORDER — NAPROXEN 250 MG PO TABS: 250.0000 mg | ORAL_TABLET | Freq: Two times a day (BID) | ORAL | Status: DC | PRN

## 2015-02-26 MED ORDER — HYDROCODONE-ACETAMINOPHEN 5-325 MG PO TABS: ORAL_TABLET | ORAL | Status: DC

## 2015-02-26 MED ORDER — MORPHINE SULFATE 4 MG/ML IJ SOLN
4.0000 mg | INTRAMUSCULAR | Status: DC | PRN
  Administered 2015-02-26: 4 mg via INTRAVENOUS
  Filled 2015-02-26: qty 1

## 2015-02-26 MED ORDER — ONDANSETRON HCL 4 MG/2ML IJ SOLN
4.0000 mg | INTRAMUSCULAR | Status: DC | PRN
  Administered 2015-02-26: 4 mg via INTRAVENOUS
  Filled 2015-02-26: qty 2

## 2015-02-26 MED ORDER — IOHEXOL 300 MG/ML  SOLN
100.0000 mL | Freq: Once | INTRAMUSCULAR | Status: AC | PRN
  Administered 2015-02-26: 100 mL via INTRAVENOUS

## 2015-02-26 MED ORDER — SODIUM CHLORIDE 0.9 % IV SOLN
INTRAVENOUS | Status: DC
  Administered 2015-02-26: 20:00:00 via INTRAVENOUS

## 2015-02-26 NOTE — ED Notes (Signed)
PT c/o sharp continuous pain to lower abdomen but denies any urinary symptoms, n/v/d x3 days. Last BM today and was normal.

## 2015-02-26 NOTE — Discharge Instructions (Signed)
°Emergency Department Resource Guide °1) Find a Doctor and Pay Out of Pocket °Although you won't have to find out who is covered by your insurance plan, it is a good idea to ask around and get recommendations. You will then need to call the office and see if the doctor you have chosen will accept you as a new patient and what types of options they offer for patients who are self-pay. Some doctors offer discounts or will set up payment plans for their patients who do not have insurance, but you will need to ask so you aren't surprised when you get to your appointment. ° °2) Contact Your Local Health Department °Not all health departments have doctors that can see patients for sick visits, but many do, so it is worth a call to see if yours does. If you don't know where your local health department is, you can check in your phone book. The CDC also has a tool to help you locate your state's health department, and many state websites also have listings of all of their local health departments. ° °3) Find a Walk-in Clinic °If your illness is not likely to be very severe or complicated, you may want to try a walk in clinic. These are popping up all over the country in pharmacies, drugstores, and shopping centers. They're usually staffed by nurse practitioners or physician assistants that have been trained to treat common illnesses and complaints. They're usually fairly quick and inexpensive. However, if you have serious medical issues or chronic medical problems, these are probably not your best option. ° °No Primary Care Doctor: °- Call [facility] at  832-8000 - they can help you locate a primary care doctor that  accepts your insurance, provides certain services, etc. °- Physician Referral Service- 1-800-533-3463 ° °Chronic Pain Problems: °Organization         Address  Phone   Notes  °[facility]  ([phone]) [phone] Patients need to be referred by their primary care doctor.  ° °Medication  Assistance: °Organization         Address  Phone   Notes  °[place] [facility] [address] °[place], [place] [address] (336) 641-8030 --Must be a resident of [place] °-- Must have NO insurance coverage whatsoever (no Medicaid/ Medicare, etc.) °-- The pt. MUST have a primary care doctor that directs their care regularly and follows them in the community °  °[facility]  (866) 331-1348   °[employer]  (888) 892-1162   ° °Agencies that provide inexpensive medical care: °Organization         Address  Phone   Notes  °[facility]  (336) 832-8035   ° Internal Medicine    (336) 832-7272   °[facility] [address] °[place],  [address] (336) 832-4777   °[facility] [address], °[place] (336) 271-4999   °[facility]    (336) 373-0678   °[facility]    (336) 272-1050   °[facility] ° [address], [place] Phone:  (336) 832-4444, Fax:  (336) 832-4440 Hours of Operation:  9 am - 6 pm, M-F.  Also accepts Medicaid/Medicare and self-pay.  °[facility] ° [address], [place] Phone: (336) [phone], Fax: (336) [phone]. Hours of Operation:  8:30 am - 5:30 pm, M-F.  Also accepts Medicaid and self-pay.  °[facility] [place] [address], [place] Phone: (336) 878-6027   °[facility] [address], [place], [place] (336)723-1848, Ext. 123 Mondays & Thursdays: 7-9 AM.  First 15 patients are seen on a first come, first serve basis. °  ° °Medicaid-accepting [place] Providers: ° °Organization         Address  Phone   Notes  °[facility] [address], [place] (336) 641-2100 Also accepts self-pay patients.  °[facility] [address], [place] ° (336) 856-9996   °[facility] [address], [place]  (336) 288-8857   °[facility] [address] [place] [address], [place] (336) 299-7000   °Veita Bland [address], [place]  ° (336) 373-1557 Only accepts [place] Access Medicaid patients after they have their name applied to their card.  ° °Self-Pay (no insurance) in [place]: ° °Organization         Address  Phone   Notes  °Sickle Cell Patients, [facility] [address], [place] (336) 832-1970   °[facility] [address], [place] (336) 832-4400   °[facility] ° [address], [place] (336) 992-4800   °[facility]/Dr. Osei-Bonsu ° [address] [place] [address], [place] or [address], [place] (336) [phone] Phone number for both [place] and [place] locations is the same.  °[facility] [address], [place] (336) 299-0000   °[facility] [address] [place] [address], [place] or [address] (336) 852-7530 °(336) 878-2260   °[facility] [address],  (336) 350-1642, phone; (336) 294-5005, fax Sees patients 1st and 3rd Saturday of every month.  Must not qualify for public or private insurance (i.e. Medicaid, Medicare, [place] Health Choice, Veterans' Benefits) • Household income should be no more than 200% of the poverty level •The clinic cannot treat you if you are pregnant or think you are pregnant • Sexually transmitted diseases are not treated at the clinic.  ° ° °Dental Care: °Organization         Address  Phone  Notes  °[place] [facility] [address],  (336) 641-6152 Accepts children up to age 21 who are enrolled in Medicaid or [place] Health Choice; pregnant women with a Medicaid card; and children who have applied for Medicaid or [place] Health Choice, but were declined, whose parents can pay a reduced fee at time of service.  °[place]  [facility] [place]  [address], [place] (336) 641-7733 Accepts children up to age 21 who are enrolled in Medicaid or [place] Health Choice; pregnant women with a Medicaid card; and children who have applied for Medicaid or [place] Health Choice, but were declined, whose parents can pay a reduced fee at time of service.  °[facility] ° [address],  (336) 641-4533 Patients are seen by appointment only. Walk-ins are not accepted. [facility] will see patients 18 years of age and older. °Monday - Tuesday (8am-5pm) °Most Wednesdays (8:30-5pm) °$30 per visit, cash only  °[facility] ° [address], [place] (336) 641-4533 Patients are seen by appointment only. Walk-ins are not accepted. [facility] will see patients 18 years of age and older. °One   Wednesday Evening (Monthly: Volunteer Based).  $30 per visit, cash only  °[facility]  (919) [phone] for adults; Children under age 4, call Graduate Pediatric Dentistry at ([phone]) [phone]. Children aged 4-14, please call (919) [phone] to request a pediatric application. ° Dental services are provided in all areas of dental care including fillings, crowns and bridges, complete and partial dentures, implants, gum treatment, root canals, and extractions. Preventive care is also provided. Treatment is provided to both adults and children. °Patients are selected via a lottery and there is often a waiting list. °  °[facility] [address], °[place] ° (336) 763-8833 [email or web address] °  °[facility] [address], [place], [place] (336)723-1848, Ext. 123 Second and Fourth Thursday of each month, opens at 6:30 AM; Clinic ends at 9 AM.  Patients are seen on a first-come first-served basis, and a limited number are seen during each clinic.  ° °[facility] ° [address], [place], [place] (336) 723-7904    Eligibility Requirements °You must have lived in [place], [place], or [place] counties for at least the last three months. °  You cannot be eligible for state or federal sponsored healthcare insurance, including Veterans Administration, Medicaid, or Medicare. °  You generally cannot be eligible for healthcare insurance through your employer.  °  How to apply: °Eligibility screenings are held every Tuesday and Wednesday afternoon from 1:00 pm until 4:00 pm. You do not need an appointment for the interview!  °[facility] [address], [place], [place] 336-631-2330   °[facility]  336-342-8273   °[facility]  336-703-3100   °[facility]  336-570-6415   ° °Behavioral Health Resources in the Community: °Intensive Outpatient Programs °Organization         Address  Phone  Notes  °[facility] [address], [place], [place] 336-878-6098   °[facility] [address], [place], [place] 336-832-9800   °[facility] [address], [place], [place] ° 336-882-2125   °[facility] [address],  °[place], [place] 1-800-853-5163 or 336-641-4981   °Substance Abuse Resources °Organization         Address  Phone  Notes  °[facility]  336-882-2125   °[facility]  336-784-9470   °[facility]  336-285-9073   °[facility]  336-845-3988   °Residential & Outpatient Substance Abuse Program  1-800-659-3381   °Psychological Services °Organization         Address  Phone  Notes  °[facility]  336- 832-9600   °[facility]  336- 378-7881   °[facility] [address], [place] 1-800-853-5163 or 336-641-4981   ° °Mobile Crisis Teams °Organization         Address  Phone  Notes  °[facility], Mobile Crisis Care Unit  1-877-626-1772   °Assertive °[facility] ° [address]  [place], [place] 336-834-9664   °Sharon DeEsch [address] °[place] 336-554-5454   ° °Self-Help/Support Groups °Organization         Address  Phone             Notes  °[facility] [place] - variety of support groups  336- 373-1402 Call for more information  °Narcotics Anonymous (NA), [facility] [address], °[place]   2 meetings at this location  ° °  Residential Treatment Programs Organization         Address  Phone  Notes  [facility] [address],    [place]  1-609 660 5316   [facility]  [address], [place] [address], [place], [place]   [place], [place] [phone] Admissions: 8am-3pm M-F  [facility] [place] [address]    [place], [place] 410-301-3143   [facility] [address] [place], [place], [place]   [facility] [address],  [place], [place]   [facility] - Intensive Outpatient [address], [place], [place]   [facility] ([place] [place]  [place], [place] 1-919 423 1203 or 620-102-3359   [facility] [address], [place], [place] Accepts Medicaid  [facility] [place] [address]  [place] 1-(618)105-4175 Substance Abuse/Addiction Treatment   [facility] Organization         Address  Phone  Notes  [facility]  313-399-3748   Domenic Schwab, PhD [address] [place] [place], [place]   480-054-7900 or 775-682-9500   [place], [place] (505)789-3133   [facility] [address] [place], [place] [phone] Insurance/Medicaid/sponsorship through [facility] [address]                                    [place], [place] (670) [phone] [place] [address] [place], [place] [phone]    Dr. Adele Schilder  [phone]   [facility] Dept. 1) [address] [place] 2) [place] 3)  [address], [place] [phone]  ([phone]) [phone]   [place] 313-484-3637 or (916) 087-8042 (After Hours)      Take the prescriptions as directed.  Apply moist heat to the area(s) of discomfort, for 15 minutes at a time, several times per day for the next few days.  Do not fall asleep on a heating pack.  Call your regular OB/GYN doctor tomorrow to schedule a follow up appointment in the next 3 days.  Return to the Emergency Department immediately if worsening.

## 2015-02-26 NOTE — ED Provider Notes (Signed)
CSN: [id number]     Arrival date & time 02/26/15  1716 History   First MD Initiated Contact with Patient 02/26/15 1845     Chief Complaint  Patient presents with  . Pelvic Pain  . Abdominal Pain      HPI Pt was seen at 1850.  Per pt, c/o gradual onset and persistence of constant lower abd "pain" for the past 3 days. Describes the abd pain as "sharp."  Denies N/V, no diarrhea, no fevers, no back pain, no rash, no CP/SOB, no black or blood in stools, no dysuria/hematuria, no vaginal bleeding/discharge.       Past Medical History  Diagnosis Date  . Bronchitis   . Asthma   . Anemia   . Uterine fibroid   . Menorrhagia    Past Surgical History  Procedure Laterality Date  . Tumor excision    . Abdominal hysterectomy     Family History  Problem Relation Age of Onset  . Cancer Father   . Diabetes Father    History  Substance Use Topics  . Smoking status: Current Every Day Smoker -- 0.50 packs/day    Types: Cigarettes  . Smokeless tobacco: Not on file  . Alcohol Use: No   OB History    Gravida Para Term Preterm AB TAB SAB Ectopic Multiple Living   4 4 4             Review of Systems ROS: Statement: All systems negative except as marked or noted in the HPI; Constitutional: Negative for fever and chills. ; ; Eyes: Negative for eye pain, redness and discharge. ; ; ENMT: Negative for ear pain, hoarseness, nasal congestion, sinus pressure and sore throat. ; ; Cardiovascular: Negative for chest pain, palpitations, diaphoresis, dyspnea and peripheral edema. ; ; Respiratory: Negative for cough, wheezing and stridor. ; ; Gastrointestinal: +abd pain. Negative for nausea, vomiting, diarrhea, blood in stool, hematemesis, jaundice and rectal bleeding. . ; ; Genitourinary: Negative for dysuria, flank pain and hematuria. ; ; Musculoskeletal: Negative for back pain and neck pain. Negative for swelling and trauma.; ; Skin: Negative for pruritus, rash, abrasions, blisters, bruising and skin lesion.;  ; Neuro: Negative for headache, lightheadedness and neck stiffness. Negative for weakness, altered level of consciousness , altered mental status, extremity weakness, paresthesias, involuntary movement, seizure and syncope.     Allergies  Pineapple and Latex  Home Medications   Prior to Admission medications   Medication Sig Start Date End Date Taking? Authorizing Provider  acetaminophen-codeine (TYLENOL #3) 300-30 MG per tablet Take 1-2 tablets by mouth every 6 (six) hours as needed. Patient not taking: Reported on 02/14/2015 04/21/14   Lily Kocher, PA-C  amoxicillin (AMOXIL) 500 MG capsule Take 1 capsule (500 mg total) by mouth 3 (three) times daily. Patient not taking: Reported on 02/14/2015 04/21/14   Lily Kocher, PA-C  amoxicillin-clavulanate (AUGMENTIN) 875-125 MG per tablet Take 1 tablet by mouth every 12 (twelve) hours. Patient not taking: Reported on 02/26/2015 02/14/15   Evalee Jefferson, PA-C  cyclobenzaprine (FLEXERIL) 5 MG tablet Take 1 tablet (5 mg total) by mouth 3 (three) times daily as needed for muscle spasms. Patient not taking: Reported on 02/14/2015 08/17/14   Evalee Jefferson, PA-C  oxyCODONE-acetaminophen (PERCOCET/ROXICET) 5-325 MG per tablet Take 1 tablet by mouth every 4 (four) hours as needed. Patient not taking: Reported on 02/14/2015 08/17/14   Evalee Jefferson, PA-C  predniSONE (DELTASONE) 10 MG tablet 6, 5, 4, 3, 2 then 1 tablet by mouth daily for 6  days total. Patient not taking: Reported on 02/14/2015 08/17/14   Evalee Jefferson, PA-C   BP 118/66 mmHg  Pulse 79  Temp(Src) 98 F (36.7 C) (Oral)  Resp 18  Ht 5\' 7"  (1.702 m)  Wt 230 lb (104.327 kg)  BMI 36.01 kg/m2  SpO2 100% Physical Exam  1855: Physical examination:  Nursing notes reviewed; Vital signs and O2 SAT reviewed;  Constitutional: Well developed, Well nourished, Well hydrated, In no acute distress; Head:  Normocephalic, atraumatic; Eyes: EOMI, PERRL, No scleral icterus; ENMT: Mouth and pharynx normal, Mucous membranes moist;  Neck: Supple, Full range of motion, No lymphadenopathy; Cardiovascular: Regular rate and rhythm, No murmur, rub, or gallop; Respiratory: Breath sounds clear & equal bilaterally, No rales, rhonchi, wheezes.  Speaking full sentences with ease, Normal respiratory effort/excursion; Chest: Nontender, Movement normal; Abdomen: Soft, +RLQ, suprapubic, LLQ tendeness to palp. No rebound or guarding. Nondistended, Normal bowel sounds; Genitourinary: No CVA tenderness; Extremities: Pulses normal, No tenderness, No edema, No calf edema or asymmetry.; Neuro: AA&Ox3, Major CN grossly intact.  Speech clear. No gross focal motor or sensory deficits in extremities.; Skin: Color normal, Warm, Dry.   ED Course  Procedures     EKG Interpretation None      MDM  MDM Reviewed: previous chart, nursing note and vitals Reviewed previous: labs Interpretation: labs and CT scan      Results for orders placed or performed during the hospital encounter of 02/26/15  CBC with Differential  Result Value Ref Range   WBC 9.5 4.0 - 10.5 K/uL   RBC 4.29 3.87 - 5.11 MIL/uL   Hemoglobin 12.1 12.0 - 15.0 g/dL   HCT 37.7 36.0 - 46.0 %   MCV 87.9 78.0 - 100.0 fL   MCH 28.2 26.0 - 34.0 pg   MCHC 32.1 30.0 - 36.0 g/dL   RDW 14.2 11.5 - 15.5 %   Platelets 321 150 - 400 K/uL   Neutrophils Relative % 48 43 - 77 %   Neutro Abs 4.6 1.7 - 7.7 K/uL   Lymphocytes Relative 40 12 - 46 %   Lymphs Abs 3.8 0.7 - 4.0 K/uL   Monocytes Relative 5 3 - 12 %   Monocytes Absolute 0.5 0.1 - 1.0 K/uL   Eosinophils Relative 6 (H) 0 - 5 %   Eosinophils Absolute 0.6 0.0 - 0.7 K/uL   Basophils Relative 1 0 - 1 %   Basophils Absolute 0.1 0.0 - 0.1 K/uL  Comprehensive metabolic panel  Result Value Ref Range   Sodium 139 135 - 145 mmol/L   Potassium 3.8 3.5 - 5.1 mmol/L   Chloride 106 96 - 112 mmol/L   CO2 24 19 - 32 mmol/L   Glucose, Bld 88 70 - 99 mg/dL   BUN 10 6 - 23 mg/dL   Creatinine, Ser 0.78 0.50 - 1.10 mg/dL   Calcium 9.3 8.4 -  10.5 mg/dL   Total Protein 7.3 6.0 - 8.3 g/dL   Albumin 4.1 3.5 - 5.2 g/dL   AST 17 0 - 37 U/L   ALT 12 0 - 35 U/L   Alkaline Phosphatase 36 (L) 39 - 117 U/L   Total Bilirubin 0.6 0.3 - 1.2 mg/dL   GFR calc non Af Amer >90 >90 mL/min   GFR calc Af Amer >90 >90 mL/min   Anion gap 9 5 - 15  Urinalysis, Routine w reflex microscopic  Result Value Ref Range   Color, Urine YELLOW YELLOW   APPearance CLEAR CLEAR  Specific Gravity, Urine 1.025 1.005 - 1.030   pH 5.5 5.0 - 8.0   Glucose, UA NEGATIVE NEGATIVE mg/dL   Hgb urine dipstick NEGATIVE NEGATIVE   Bilirubin Urine NEGATIVE NEGATIVE   Ketones, ur NEGATIVE NEGATIVE mg/dL   Protein, ur NEGATIVE NEGATIVE mg/dL   Urobilinogen, UA 0.2 0.0 - 1.0 mg/dL   Nitrite NEGATIVE NEGATIVE   Leukocytes, UA NEGATIVE NEGATIVE  Lipase, blood  Result Value Ref Range   Lipase 28 11 - 59 U/L   Ct Abdomen Pelvis W Contrast 02/26/2015   CLINICAL DATA:  Pelvic and abdominal pain  EXAM: CT ABDOMEN AND PELVIS WITH CONTRAST  TECHNIQUE: Multidetector CT imaging of the abdomen and pelvis was performed using the standard protocol following bolus administration of intravenous contrast.  CONTRAST:  141mL OMNIPAQUE IOHEXOL 300 MG/ML  SOLN  COMPARISON:  10/25/2012  FINDINGS: BODY WALL: No contributory findings.  LOWER CHEST: No contributory findings.  ABDOMEN/PELVIS:  Liver: Presumed sub cm cyst in the lower right liver.  Biliary: No evidence of biliary obstruction or stone.  Pancreas: Unremarkable.  Spleen: Unremarkable.  Adrenals: Unremarkable.  Kidneys and ureters: No hydronephrosis or stone.  Bladder: Unremarkable.  Reproductive: Hysterectomy. Prominent left ovarian size with multiple visible cysts/follicles, the largest measuring up to 34 mm.  Bowel: No obstruction. Luminal filling defect at the level of the ileocecal valve is most consistent with stool. The density is similar to material seen within the terminal ileum and there is no associated wall thickening or  ileocecal valve obstruction as would be expected if this were a true colonic mass.  Retroperitoneum: No mass or adenopathy.  Peritoneum: No ascites or pneumoperitoneum.  Vascular: No acute abnormality. Age advanced aortic and branch vessel atherosclerosis.  OSSEOUS: No acute abnormalities.  IMPRESSION: 1. No acute findings. 2. Mild cystic enlargement the left ovary with single largest cyst measuring 34 mm.   Electronically Signed   By: Monte Fantasia M.D.   On: 02/26/2015 21:25    2135:  Pt s/p hysterectomy. Left ovarian cyst, otherwise workup reassuring. Tx symptomatically at this time, f/u OB/GYN MD. Dx and testing d/w pt.  Questions answered.  Verb understanding, agreeable to d/c home with outpt f/u.   Francine Graven, DO 03/01/15 1640

## 2015-06-05 ENCOUNTER — Emergency Department (HOSPITAL_COMMUNITY)
Admission: EM | Admit: 2015-06-05 | Discharge: 2015-06-05 | Disposition: A | Discharge status: Home / Self Care | Payer: Self-pay | Attending: Emergency Medicine | Admitting: Emergency Medicine

## 2015-06-05 ENCOUNTER — Encounter (HOSPITAL_COMMUNITY): Payer: Self-pay | Admitting: Emergency Medicine

## 2015-06-05 DIAGNOSIS — M79621 Pain in right upper arm: Secondary | ICD-10-CM | POA: Insufficient documentation

## 2015-06-05 DIAGNOSIS — Z86018 Personal history of other benign neoplasm: Secondary | ICD-10-CM | POA: Insufficient documentation

## 2015-06-05 DIAGNOSIS — Z8742 Personal history of other diseases of the female genital tract: Secondary | ICD-10-CM | POA: Insufficient documentation

## 2015-06-05 DIAGNOSIS — Z792 Long term (current) use of antibiotics: Secondary | ICD-10-CM | POA: Insufficient documentation

## 2015-06-05 DIAGNOSIS — J45909 Unspecified asthma, uncomplicated: Secondary | ICD-10-CM | POA: Insufficient documentation

## 2015-06-05 DIAGNOSIS — Z91040 Latex allergy status: Secondary | ICD-10-CM | POA: Insufficient documentation

## 2015-06-05 DIAGNOSIS — Z862 Personal history of diseases of the blood and blood-forming organs and certain disorders involving the immune mechanism: Secondary | ICD-10-CM | POA: Insufficient documentation

## 2015-06-05 DIAGNOSIS — Z720 Tobacco use: Secondary | ICD-10-CM | POA: Insufficient documentation

## 2015-06-05 MED ORDER — NAPROXEN 500 MG PO TABS
500.0000 mg | ORAL_TABLET | Freq: Two times a day (BID) | ORAL | Status: DC
Start: 2015-06-05 — End: 2016-03-06

## 2015-06-05 MED ORDER — PREDNISONE 50 MG PO TABS: ORAL_TABLET | ORAL | Status: DC

## 2015-06-05 NOTE — ED Notes (Signed)
Past 2 weeks has been having increasing pain in rt upper arm /biscept /triscept pain -

## 2015-06-05 NOTE — Discharge Instructions (Signed)
Medication for prednisone  and an anti-inflammatory medication.  Try to get a primary care physician. Resource guide given.    Emergency Department Resource Guide 1) Find a Doctor and Pay Out of Pocket Although you won't have to find out who is covered by your insurance plan, it is a good idea to ask around and get recommendations. You will then need to call the office and see if the doctor you have chosen will accept you as a new patient and what types of options they offer for patients who are self-pay. Some doctors offer discounts or will set up payment plans for their patients who do not have insurance, but you will need to ask so you aren't surprised when you get to your appointment.  2) Contact Your Local Health Department Not all health departments have doctors that can see patients for sick visits, but many do, so it is worth a call to see if yours does. If you don't know where your local health department is, you can check in your phone book. The CDC also has a tool to help you locate your state's health department, and many state websites also have listings of all of their local health departments.  3) Find a [facility] If your illness is not likely to be very severe or complicated, you may want to try a walk in clinic. These are popping up all over the country in pharmacies, drugstores, and shopping centers. They're usually staffed by nurse practitioners or physician assistants that have been trained to treat common illnesses and complaints. They're usually fairly quick and inexpensive. However, if you have serious medical issues or chronic medical problems, these are probably not your best option.  No Primary Care Doctor: - Call Health Connect at  [phone] - they can help you locate a primary care doctor that  accepts your insurance, provides certain services, etc. - Physician Referral Service- [phone]  Chronic Pain Problems: Organization         Address  Phone    Notes  [facility]  [phone] Patients need to be referred by their primary care doctor.   Medication Assistance: Organization         Address  Phone   Notes  [facility] Medication [facility] [address] [place], [place] [address] [phone] --Must be a resident of [facility] -- Must have NO insurance coverage whatsoever (no Medicaid/ Medicare, etc.) -- The pt. MUST have a primary care doctor that directs their care regularly and follows them in the community   MedAssist  [phone]   [employer]  [phone]    Agencies that provide inexpensive medical care: Organization         Address  Phone   Notes  [place]  [phone]   [facility]    [phone]   [facility] [place],  [address] [phone]   [place] [address] [place] [phone]   Planned Parenthood    [phone]   [facility]    [phone]   [place] and [place] [address], [place] Phone:  [phone], Fax:  [phone] Hours of Operation:  9 am - 6 pm, M-F.  Also accepts Medicaid/Medicare and self-pay.  [facility] for [place] [address], [place] Phone: [phone], Fax: [phone])  [phone]. Hours of Operation:  8:30 am - 5:30 pm, M-F.  Also accepts Medicaid and self-pay.  [facility] [place] [address] [place] Phone: [phone]   [place], [place], [place] [phone], Ext. [phone] Mondays & Thursdays: 7-9 AM.  First 15 patients are seen on a first come, first serve basis.    [place] Providers:  Organization         Address  Phone   Notes  [facility] [address], [place] [phone] Also accepts self-pay patients.  [facility]  [address], [place]  [phone]   [place], [address], [place] [phone]   [facility] [address], [place] [phone]   Lucianne Lei [address], [place]   [phone] Only accepts [place] Access [place] patients after they have their name applied to their card.   Self-Pay (no insurance) in [facility]:  Organization         Address  Phone   Notes  Sickle Cell Patients, [facility] [place] [phone]   [facility] [place] [phone]   [facility] [place]  [place], [place], [place] [phone]   [facility]/Dr. Osei-Bonsu  [address] [place] or [address], [place] [phone] Phone number for both [place] and [place] locations is the same.  Urgent Medical and [facility] [address], [place] [phone]   [facility] [address] [place] or [address] [phone]   [facility] [address], [place] [phone], phone; [phone], fax Sees patients 1st and 3rd Saturday of every month.  Must not qualify for public or private insurance (i.e. Medicaid, Medicare, [place] Health Choice, Veterans' Benefits)  Household income should be no more than 200% of the poverty level The clinic cannot treat you if you are pregnant or think you are pregnant  Sexually transmitted diseases are not treated at the clinic.    Dental Care: Organization         Address  Phone  Notes  [facility] Department of [facility] [place] [phone] Accepts children up to age 46 who are enrolled in [place] or [place]; pregnant women with a Medicaid card; and children who have  applied for Medicaid or [place] Health Choice, but were declined, whose parents can pay a reduced fee at time of service.  [facility] of [facility]  [address], [place] [phone] Accepts children up to age 51 who are enrolled in [place] or [place]; pregnant women with a Medicaid card; and children who have applied for Medicaid or [place] Health Choice, but were declined, whose parents can pay a reduced fee at time of service.  [place] Adult Dental Access PROGRAM  [place] [phone] Patients are seen by appointment only. Walk-ins are not accepted. [place] will see patients 47 years of age and older. Monday - Tuesday (8am-5pm) Most Wednesdays (8:30-5pm) $30 per visit, cash only  [facility] Adult [employer] PROGRAM  [address], [place] [phone]  Patients are seen by appointment only. Walk-ins are not accepted. [place] will see patients 64 years of age and older. One Wednesday Evening (Monthly: Volunteer Based).  $30 per visit, cash only  [place]  [phone] for adults; Children under age 24, call Graduate Pediatric Dentistry at [phone]. Children aged 52-14, please call [phone] to request a pediatric application.  Dental services are provided in all areas of dental care including fillings, crowns and bridges, complete and partial dentures, implants, gum treatment, root canals, and extractions. Preventive care is also provided. Treatment is provided to both adults and children. Patients are selected via a lottery and there is often a waiting list.   [facility] [address], [place]  [phone] [email or web address]   [facility] [address] [place], [place] [phone], Ext. [phone] Second and Fourth Thursday of each month, opens at 6:30 AM; Clinic ends at 9 AM.  Patients are seen on a first-come first-served basis, and a  limited number are seen during each clinic.   [facility]  [address] Hillard Danker [place], [place] [phone]   Eligibility Requirements You must have lived in [place], [place], or [place] counties for at least the last three months.   You cannot be eligible for state or federal sponsored [employer], including [employer], [place], or [employer].   You generally cannot be eligible for healthcare insurance through your employer.    How to apply: Eligibility screenings are held every Tuesday and Wednesday afternoon from 1:00 pm until 4:00 pm. You do not need an appointment for the interview!  [facility] [address], [place], [place]   [place]  [place] Department  [place]  [phone]    Behavioral Health Resources in the Community: Intensive Outpatient Programs Organization         Address  Phone  Notes  [place] [address] [place], [place] [phone]   [facility] [address], [place], [place]   ADS: Alcohol & Drug Svcs [address], [place], [place]   [place] [address],  [place], [place] or [phone]   Substance Abuse Resources Organization         Address  Phone  Notes  Alcohol and Drug Services  [phone]   [place]  [phone]   [facility] [place]   Chinita Pester  [phone]   Residential & Outpatient Substance Abuse Program  [phone]   Psychological Services Organization         Address  Phone  Notes  [facility] Nina  [place]  [phone]   [place] [address], [place] or [phone]    Mobile Crisis Teams Organization          Address  Phone  Notes  [facility], Mobile Crisis Care Unit  [phone]   [facility]  [address] [place], [place]   [place] [address], [place] [phone]    Self-Help/Support Groups Organization         Address  Phone             Notes  [place]. of [facility] - variety of support groups  [place]  Call for more information  Narcotics Anonymous (NA), [facility] [address], [employer] [place]  2 meetings at this location   Residential Facilities manager         Address  Phone  Notes  [facility] [place],    [place]  [phone]   [facility]  [address] [place] [phone], [place], [place]   [place], [place] [phone] Admissions: 8am-3pm M-F  Incentives Substance [place] [address]    [place], [place] [phone]   [facility] [address] [place], [place], [place]   [facility] [address]  [place], [place]   [facility] Programs - Intensive Outpatient [address], [place], [place]   [facility] [place].,  [place], [place] [phone] or [phone]   [facility] Services (RTS) [address], [place], [place] Accepts Medicaid  [facility] [place] [address],  [place] [phone] Substance Abuse/Addiction Treatment   [facility] Organization         Address  Phone  Notes  [facility]  [phone]   Domenic Schwab, PhD [address] Arlis Porta [place], [place]   [phone] or [phone]   [place], [place] [phone]   [facility] [address] [place], [place] [phone] Insurance/Medicaid/sponsorship  through [facility] and Families [address], [address] [place]                                    [place], [place] [phone] [place] [address], [place] [phone]    Dr. Adele Schilder  [phone]   [facility] [place] Dept. 1) [address] [place] 2) [place] 3)  [place] of [place] [address], [place] [phone]  [phone]   [place] [phone] or [phone] (After Hours)

## 2015-06-05 NOTE — ED Provider Notes (Signed)
CSN: [id number]     Arrival date & time 06/05/15  1651 History   First MD Initiated Contact with Patient 06/05/15 1713     Chief Complaint  Patient presents with  . Arm Pain     (Consider location/radiation/quality/duration/timing/severity/associated sxs/prior Treatment) HPI..... Pain in right upper extremity in the proximal lateral aspect of the humerus. No trauma. 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 She does not use the computer excessively. Severity is mild to moderate. Palpation and positioning  make pain worse.  Past Medical History  Diagnosis Date  . Bronchitis   . Asthma   . Anemia   . Uterine fibroid   . Menorrhagia    Past Surgical History  Procedure Laterality Date  . Tumor excision    . Abdominal hysterectomy     Family History  Problem Relation Age of Onset  . Cancer Father   . Diabetes Father    History  Substance Use Topics  . Smoking status: Current Every Day Smoker -- 0.50 packs/day    Types: Cigarettes  . Smokeless tobacco: Not on file  . Alcohol Use: No   OB History    Gravida Para Term Preterm AB TAB SAB Ectopic Multiple Living   4 4 4             Review of Systems  All other systems reviewed and are negative.     Allergies  Pineapple and Latex  Home Medications   Prior to Admission medications   Medication Sig Start Date End Date Taking? Authorizing Provider  acetaminophen-codeine (TYLENOL #3) 300-30 MG per tablet Take 1-2 tablets by mouth every 6 (six) hours as needed. Patient not taking: Reported on 02/14/2015 04/21/14   Lily Kocher, PA-C  amoxicillin (AMOXIL) 500 MG capsule Take 1 capsule (500 mg total) by mouth 3 (three) times daily. Patient not taking: Reported on 02/14/2015 04/21/14   Lily Kocher, PA-C  amoxicillin-clavulanate (AUGMENTIN) 875-125 MG per tablet Take 1 tablet by mouth every 12 (twelve) hours. Patient not taking: Reported on 02/26/2015 02/14/15   Evalee Jefferson, PA-C  cyclobenzaprine (FLEXERIL) 5 MG  tablet Take 1 tablet (5 mg total) by mouth 3 (three) times daily as needed for muscle spasms. Patient not taking: Reported on 02/14/2015 08/17/14   Evalee Jefferson, PA-C  HYDROcodone-acetaminophen (NORCO/VICODIN) 5-325 MG per tablet 1 or 2 tabs PO q6 hours prn pain 02/26/15   Francine Graven, DO  naproxen (NAPROSYN) 500 MG tablet Take 1 tablet (500 mg total) by mouth 2 (two) times daily. 06/05/15   Nat Christen, MD  oxyCODONE-acetaminophen (PERCOCET/ROXICET) 5-325 MG per tablet Take 1 tablet by mouth every 4 (four) hours as needed. Patient not taking: Reported on 02/14/2015 08/17/14   Evalee Jefferson, PA-C  predniSONE (DELTASONE) 50 MG tablet 1 tablet daily for 7 days, one half tablet daily for 7 days 06/05/15   Nat Christen, MD   BP 114/59 mmHg  Pulse 71  Temp(Src) 98.2 F (36.8 C) (Oral)  Resp 18  Ht 5\' 7"  (1.702 m)  Wt 236 lb (107.049 kg)  BMI 36.95 kg/m2  SpO2 100% Physical Exam  Constitutional: She is oriented to person, place, and time. She appears well-developed and well-nourished.  HENT:  Head: Normocephalic and atraumatic.  Eyes: Conjunctivae are normal. Pupils are equal, round, and reactive to light.  Neck: Normal range of motion. Neck supple.  Musculoskeletal:  Right upper extremity: Tender on proximal lateral aspect of rotator cuff pain with range of motion of shoulder.  Neurological: She is alert and oriented to person, place, and time.  Skin: Skin is warm and dry.  Psychiatric: She has a normal mood and affect. Her behavior is normal.  Nursing note and vitals reviewed.   ED Course  Procedures (including critical care time) Labs Review Labs Reviewed - No data to display  Imaging Review No results found.   EKG Interpretation None      MDM   Final diagnoses:  Pain of right upper arm    Suspect rotator cuff tendinitis. Rx Naprosyn 500 mg and prednisone. Primary care referral.    Nat Christen, MD 06/05/15 1810

## 2015-06-05 NOTE — ED Notes (Signed)
Discharge instructions given to pt - Instructed to utilize ice and pain medication

## 2015-10-13 ENCOUNTER — Emergency Department (HOSPITAL_COMMUNITY): Payer: Self-pay

## 2015-10-13 ENCOUNTER — Encounter (HOSPITAL_COMMUNITY): Payer: Self-pay

## 2015-10-13 ENCOUNTER — Emergency Department (HOSPITAL_COMMUNITY)
Admission: EM | Admit: 2015-10-13 | Discharge: 2015-10-13 | Disposition: A | Discharge status: Home / Self Care | Payer: Self-pay | Attending: Emergency Medicine | Admitting: Emergency Medicine

## 2015-10-13 DIAGNOSIS — X58XXXA Exposure to other specified factors, initial encounter: Secondary | ICD-10-CM | POA: Insufficient documentation

## 2015-10-13 DIAGNOSIS — F17210 Nicotine dependence, cigarettes, uncomplicated: Secondary | ICD-10-CM | POA: Insufficient documentation

## 2015-10-13 DIAGNOSIS — Y9389 Activity, other specified: Secondary | ICD-10-CM | POA: Insufficient documentation

## 2015-10-13 DIAGNOSIS — Y998 Other external cause status: Secondary | ICD-10-CM | POA: Insufficient documentation

## 2015-10-13 DIAGNOSIS — Z86018 Personal history of other benign neoplasm: Secondary | ICD-10-CM | POA: Insufficient documentation

## 2015-10-13 DIAGNOSIS — Z91040 Latex allergy status: Secondary | ICD-10-CM | POA: Insufficient documentation

## 2015-10-13 DIAGNOSIS — Z862 Personal history of diseases of the blood and blood-forming organs and certain disorders involving the immune mechanism: Secondary | ICD-10-CM | POA: Insufficient documentation

## 2015-10-13 DIAGNOSIS — Y9289 Other specified places as the place of occurrence of the external cause: Secondary | ICD-10-CM | POA: Insufficient documentation

## 2015-10-13 DIAGNOSIS — J45909 Unspecified asthma, uncomplicated: Secondary | ICD-10-CM | POA: Insufficient documentation

## 2015-10-13 DIAGNOSIS — Z8742 Personal history of other diseases of the female genital tract: Secondary | ICD-10-CM | POA: Insufficient documentation

## 2015-10-13 DIAGNOSIS — S8991XA Unspecified injury of right lower leg, initial encounter: Secondary | ICD-10-CM | POA: Insufficient documentation

## 2015-10-13 MED ORDER — HYDROCODONE-ACETAMINOPHEN 5-325 MG PO TABS: 1.0000 | ORAL_TABLET | ORAL | Status: DC | PRN

## 2015-10-13 MED ORDER — HYDROCODONE-ACETAMINOPHEN 5-325 MG PO TABS
1.0000 | ORAL_TABLET | Freq: Once | ORAL | Status: AC
  Administered 2015-10-13: 1 via ORAL
  Filled 2015-10-13: qty 1

## 2015-10-13 NOTE — Discharge Instructions (Signed)
Wear the knee brace and use crutches to minimize weight bearing on your right knee.  Use ice and elevation as much as possible for the next several days to help reduce the swelling.  Take the medications prescribed.  You may take the hydrocodone prescribed for pain relief.  This will make you drowsy - do not drive within 4 hours of taking this medication.  Use your home ibuprofen 600 mg (3 tablets) every 6 hours also to help with inflammation.  Call the orthopedic doctor listed for a recheck of your injury within1 week.  I hope you have a simple knee sprain, but you may also have injured the cartilage (meniscus) of your knee as discussed.

## 2015-10-13 NOTE — ED Notes (Signed)
Discharge instructions given to pt - Discussed need to fu with orthopedist . Work note provided,- Wheeled off unit by family

## 2015-10-13 NOTE — ED Notes (Signed)
Patient states she mis-stepped off a curb at home this am and twisted her right knee. Knee is now swollen and painful

## 2015-10-16 NOTE — ED Provider Notes (Signed)
CSN: [id number]     Arrival date & time 10/13/15  1759 History   First MD Initiated Contact with Patient 10/13/15 1939     Chief Complaint  Patient presents with  . Knee Pain     (Consider location/radiation/quality/duration/timing/severity/associated sxs/prior Treatment) Patient is a 44 y.o. female presenting with knee pain. The history is provided by the patient.  Knee Pain Location:  Knee Time since incident:  8 hours Injury: yes (she stepped off a curb and twisted her knee.)   Knee location:  R knee Pain details:    Quality:  Sharp and throbbing   Radiates to:  Does not radiate   Severity:  Moderate   Onset quality:  Sudden   Duration:  8 hours   Timing:  Constant   Progression:  Unchanged Chronicity:  New Dislocation: no   Prior injury to area:  No Worsened by:  Bearing weight, activity, flexion and extension Ineffective treatments:  NSAIDs Associated symptoms: decreased ROM and swelling   Associated symptoms: no back pain, no fever, no numbness, no stiffness and no tingling     Past Medical History  Diagnosis Date  . Bronchitis   . Asthma   . Anemia   . Uterine fibroid   . Menorrhagia    Past Surgical History  Procedure Laterality Date  . Tumor excision    . Abdominal hysterectomy     Family History  Problem Relation Age of Onset  . Cancer Father   . Diabetes Father    Social History  Substance Use Topics  . Smoking status: Current Every Day Smoker -- 0.50 packs/day    Types: Cigarettes  . Smokeless tobacco: None  . Alcohol Use: No   OB History    Gravida Para Term Preterm AB TAB SAB Ectopic Multiple Living   4 4 4             Review of Systems  Constitutional: Negative for fever.  Musculoskeletal: Positive for joint swelling and arthralgias. Negative for myalgias, back pain and stiffness.  Neurological: Negative for weakness and numbness.      Allergies  Pineapple and Latex  Home Medications   Prior to Admission medications    Medication Sig Start Date End Date Taking? Authorizing Provider  ibuprofen (ADVIL,MOTRIN) 200 MG tablet Take 400 mg by mouth every 6 (six) hours as needed for mild pain or moderate pain.   Yes Historical Provider, MD  predniSONE (DELTASONE) 50 MG tablet 1 tablet daily for 7 days, one half tablet daily for 7 days Patient taking differently: Take 50 mg by mouth daily as needed. For flare up 06/05/15  Yes Nat Christen, MD  HYDROcodone-acetaminophen (NORCO/VICODIN) 5-325 MG tablet Take 1 tablet by mouth every 4 (four) hours as needed. 10/13/15   Evalee Jefferson, PA-C  naproxen (NAPROSYN) 500 MG tablet Take 1 tablet (500 mg total) by mouth 2 (two) times daily. Patient taking differently: Take 500 mg by mouth 2 (two) times daily as needed for mild pain or moderate pain.  06/05/15   Nat Christen, MD   BP 110/59 mmHg  Pulse 59  Temp(Src) 98 F (36.7 C) (Oral)  Resp 18  Ht 5\' 7"  (1.702 m)  Wt 106.595 kg  BMI 36.80 kg/m2  SpO2 99% Physical Exam  Constitutional: She appears well-developed and well-nourished.  HENT:  Head: Atraumatic.  Neck: Normal range of motion.  Cardiovascular:  Pulses equal bilaterally  Musculoskeletal: She exhibits tenderness.       Right knee: She exhibits decreased range  of motion. She exhibits no swelling, no effusion, no ecchymosis, no erythema, normal alignment, no LCL laxity, normal patellar mobility and no MCL laxity. Tenderness found. Medial joint line and patellar tendon tenderness noted.  ttp right medial joint line radiating to patellar ligament.  No ligament deformity, no appreciable edema of the knee with comparison to left knee.  No crepitus.  No weakness or knee joint collapse with SLR right leg.  Neurological: She is alert. She has normal strength. She displays normal reflexes. No sensory deficit.  Skin: Skin is warm and dry.  Psychiatric: She has a normal mood and affect.    ED Course  Procedures (including critical care time)  CLINICAL DATA: Medial and knee  pain, injury, missed step at curb  EXAM: RIGHT KNEE - COMPLETE 4+ VIEW  COMPARISON: None.  FINDINGS: Four views of the right knee submitted. No acute fracture or subluxation. Mild narrowing of medial joint compartment. No joint effusion.  IMPRESSION: No acute fracture or subluxation. Mild narrowing of medial joint compartment.   Electronically Signed By: Lahoma Crocker M.D. On: 10/13/2015 18:56  I have personally reviewed and evaluated these images and lab results as part of my medical decision-making.    MDM   Final diagnoses:  Knee injury, right, initial encounter     Radiological studies were viewed, interpreted and considered during the medical decision making and disposition process. I agree with radiologists reading.  Results were also discussed with patient.  Pt given knee immobilizer, crutches, hydrocodone prescribed. Advised f/u with ortho, referral given, hydrocodone, continued ibuprofen for inflammation relief, RICE.       Evalee Jefferson, PA-C 10/16/15 [place], DO 10/18/15 [phone]

## 2016-03-06 ENCOUNTER — Emergency Department (HOSPITAL_COMMUNITY)
Admission: EM | Admit: 2016-03-06 | Discharge: 2016-03-06 | Disposition: A | Discharge status: Home / Self Care | Payer: Self-pay | Attending: Emergency Medicine | Admitting: Emergency Medicine

## 2016-03-06 ENCOUNTER — Encounter (HOSPITAL_COMMUNITY): Payer: Self-pay | Admitting: Emergency Medicine

## 2016-03-06 DIAGNOSIS — Z791 Long term (current) use of non-steroidal anti-inflammatories (NSAID): Secondary | ICD-10-CM | POA: Insufficient documentation

## 2016-03-06 DIAGNOSIS — F17210 Nicotine dependence, cigarettes, uncomplicated: Secondary | ICD-10-CM | POA: Insufficient documentation

## 2016-03-06 DIAGNOSIS — Z79899 Other long term (current) drug therapy: Secondary | ICD-10-CM | POA: Insufficient documentation

## 2016-03-06 DIAGNOSIS — M25561 Pain in right knee: Secondary | ICD-10-CM | POA: Insufficient documentation

## 2016-03-06 DIAGNOSIS — J45909 Unspecified asthma, uncomplicated: Secondary | ICD-10-CM | POA: Insufficient documentation

## 2016-03-06 DIAGNOSIS — M25562 Pain in left knee: Secondary | ICD-10-CM | POA: Insufficient documentation

## 2016-03-06 HISTORY — DX: Pain in unspecified knee: M25.569

## 2016-03-06 MED ORDER — DICLOFENAC SODIUM 75 MG PO TBEC: 75.0000 mg | DELAYED_RELEASE_TABLET | Freq: Two times a day (BID) | ORAL | Status: DC

## 2016-03-06 MED ORDER — DEXAMETHASONE SODIUM PHOSPHATE 4 MG/ML IJ SOLN
8.0000 mg | Freq: Once | INTRAMUSCULAR | Status: AC
  Administered 2016-03-06: 8 mg via INTRAMUSCULAR
  Filled 2016-03-06: qty 2

## 2016-03-06 MED ORDER — DIAZEPAM 5 MG PO TABS
5.0000 mg | ORAL_TABLET | Freq: Once | ORAL | Status: AC
  Administered 2016-03-06: 5 mg via ORAL
  Filled 2016-03-06: qty 1

## 2016-03-06 MED ORDER — KETOROLAC TROMETHAMINE 10 MG PO TABS
10.0000 mg | ORAL_TABLET | Freq: Once | ORAL | Status: AC
  Administered 2016-03-06: 10 mg via ORAL
  Filled 2016-03-06: qty 1

## 2016-03-06 MED ORDER — METHOCARBAMOL 500 MG PO TABS: 500.0000 mg | ORAL_TABLET | Freq: Three times a day (TID) | ORAL | Status: DC

## 2016-03-06 MED ORDER — DEXAMETHASONE 4 MG PO TABS: 4.0000 mg | ORAL_TABLET | Freq: Two times a day (BID) | ORAL | Status: DC

## 2016-03-06 NOTE — Discharge Instructions (Signed)
It would probably be helpful for you to use a walker while getting around. Please keep your knees elevated above your waist is much as possible. Use diclofenac and Decadron 2 times daily with food. Use Robaxin for spasm pain behind the knee. Please see Dr. Aline Brochure, or the orthopedic specialist of your choice as sone as possible for orthopedic evaluation of your pain and discomfort. Knee Pain Knee pain is a very common symptom and can have many causes. Knee pain often goes away when you follow your health care provider's instructions for relieving pain and discomfort at home. However, knee pain can develop into a condition that needs treatment. Some conditions may include:  Arthritis caused by wear and tear (osteoarthritis).  Arthritis caused by swelling and irritation (rheumatoid arthritis or gout).  A cyst or growth in your knee.  An infection in your knee joint.  An injury that will not heal.  Damage, swelling, or irritation of the tissues that support your knee (torn ligaments or tendinitis). If your knee pain continues, additional tests may be ordered to diagnose your condition. Tests may include X-rays or other imaging studies of your knee. You may also need to have fluid removed from your knee. Treatment for ongoing knee pain depends on the cause, but treatment may include:  Medicines to relieve pain or swelling.  Steroid injections in your knee.  Physical therapy.  Surgery. HOME CARE INSTRUCTIONS  Take medicines only as directed by your health care provider.  Rest your knee and keep it raised (elevated) while you are resting.  Do not do things that cause or worsen pain.  Avoid high-impact activities or exercises, such as running, jumping rope, or doing jumping jacks.  Apply ice to the knee area:  Put ice in a plastic bag.  Place a towel between your skin and the bag.  Leave the ice on for 20 minutes, 2-3 times a day.  Ask your health care provider if you should wear  an elastic knee support.  Keep a pillow under your knee when you sleep.  Lose weight if you are overweight. Extra weight can put pressure on your knee.  Do not use any tobacco products, including cigarettes, chewing tobacco, or electronic cigarettes. If you need help quitting, ask your health care provider. Smoking may slow the healing of any bone and joint problems that you may have. SEEK MEDICAL CARE IF:  Your knee pain continues, changes, or gets worse.  You have a fever along with knee pain.  Your knee buckles or locks up.  Your knee becomes more swollen. SEEK IMMEDIATE MEDICAL CARE IF:   Your knee joint feels hot to the touch.  You have chest pain or trouble breathing.   This information is not intended to replace advice given to you by your health care provider. Make sure you discuss any questions you have with your health care provider.   Document Released: 08/31/2007 Document Revised: 11/24/2014 Document Reviewed: 06/19/2014 Elsevier Interactive Patient Education [employer].

## 2016-03-06 NOTE — ED Provider Notes (Signed)
CSN: [id number]     Arrival date & time 03/06/16  1954 History   First MD Initiated Contact with Patient 03/06/16 2104     Chief Complaint  Patient presents with  . Knee Pain     (Consider location/radiation/quality/duration/timing/severity/associated sxs/prior Treatment) HPI Comments: The patient is a 45 year old female who presents to the emergency department with a complaint of bilateral knee pain. The patient states that she initially had problems with the right knee. She was told that there could be some cartilage issues with it. She was treated with a knee immobilizer and given a referral to orthopedics. She states that she has not gone to see an orthopedic person as of yet. She states that over the last couple of days she has left knee pain. She states that she has pain on both sides and in the back of the knee. She has not noticed a hot joint, but she has noted significant swelling of the left knee. She presents to the emergency room department because she says that she start of hurting, and this is interfering with her work. She's not had any new injury or trauma to the knees. There's been no new procedures reported. Having to do the standing, walking, and occasional stooping aggravates the pain. Nothing seems to make the pain any better.  The history is provided by the patient.    Past Medical History  Diagnosis Date  . Bronchitis   . Asthma   . Anemia   . Uterine fibroid   . Menorrhagia   . Knee pain    Past Surgical History  Procedure Laterality Date  . Tumor excision    . Abdominal hysterectomy     Family History  Problem Relation Age of Onset  . Cancer Father   . Diabetes Father    Social History  Substance Use Topics  . Smoking status: Current Every Day Smoker -- 0.50 packs/day    Types: Cigarettes  . Smokeless tobacco: None  . Alcohol Use: No   OB History    Gravida Para Term Preterm AB TAB SAB Ectopic Multiple Living   4 4 4             Review of Systems   Musculoskeletal: Positive for arthralgias.  All other systems reviewed and are negative.     Allergies  Pineapple and Latex  Home Medications   Prior to Admission medications   Medication Sig Start Date End Date Taking? Authorizing Provider  acetaminophen (TYLENOL) 500 MG tablet Take 1,000 mg by mouth daily.   Yes Historical Provider, MD  ibuprofen (ADVIL,MOTRIN) 200 MG tablet Take 800 mg by mouth daily.    Yes Historical Provider, MD   BP 116/59 mmHg  Pulse 64  Temp(Src) 98.3 F (36.8 C) (Oral)  Resp 16  Ht 5\' 7"  (1.702 m)  Wt 104.327 kg  BMI 36.01 kg/m2  SpO2 100% Physical Exam  Constitutional: She is oriented to person, place, and time. She appears well-developed and well-nourished.  Non-toxic appearance.  HENT:  Head: Normocephalic.  Right Ear: Tympanic membrane and external ear normal.  Left Ear: Tympanic membrane and external ear normal.  Eyes: EOM and lids are normal. Pupils are equal, round, and reactive to light.  Neck: Normal range of motion. Neck supple. Carotid bruit is not present.  Cardiovascular: Normal rate, regular rhythm, normal heart sounds, intact distal pulses and normal pulses.   Pulmonary/Chest: Breath sounds normal. No respiratory distress.  Abdominal: Soft. Bowel sounds are normal. There is no tenderness.  There is no guarding.  Musculoskeletal:       Right knee: She exhibits decreased range of motion and swelling. Tenderness found.       Left knee: She exhibits decreased range of motion and swelling. Tenderness found. Medial joint line and lateral joint line tenderness noted.  No hot joints, no posterior mass.  Lymphadenopathy:       Head (right side): No submandibular adenopathy present.       Head (left side): No submandibular adenopathy present.    She has no cervical adenopathy.  Neurological: She is alert and oriented to person, place, and time. She has normal strength. No cranial nerve deficit or sensory deficit.  Skin: Skin is warm and  dry.  Psychiatric: She has a normal mood and affect. Her speech is normal.  Nursing note and vitals reviewed.   ED Course  Procedures (including critical care time) Labs Review Labs Reviewed - No data to display  Imaging Review No results found. I have personally reviewed and evaluated these images and lab results as part of my medical decision-making.   EKG Interpretation None      MDM  Vital signs are well within normal limits. The examination favors degenerative changes of right and left knee. Also question some muscle strain and tendon strain of the left lower extremity area. No evidence for septic joint. No trauma to suspect fracture or dislocation.  I offered the patient a knee immobilizer. She states that she has a couple of those at home. She says they're too heavy and they interfere with her work. I attempted to explain to the patient that the idea is to immobilize the knee until she is seen by the orthopedic specialist. The patient will be treated with Decadron, diclofenac, and Robaxin. The patient will follow-up with orthopedics as soon as possible concerning her knees. Patient is given a note excusing her from work duty over the weekend.    Final diagnoses:  None    *I have reviewed nursing notes, vital signs, and all appropriate lab and imaging results for this patient.[address], PA-C 03/07/16 [id number]  Tanna Furry, MD 03/17/16 1224

## 2016-03-06 NOTE — ED Notes (Signed)
Having bi-lateral knee pain.  The right knee is swollen and painful per pt.  Left knee cartilage is gone per pt.

## 2016-03-24 ENCOUNTER — Ambulatory Visit: Payer: Self-pay | Admitting: Orthopedic Surgery

## 2016-03-24 ENCOUNTER — Encounter: Payer: Self-pay | Admitting: Orthopedic Surgery

## 2016-07-01 ENCOUNTER — Encounter (HOSPITAL_COMMUNITY): Payer: Self-pay | Admitting: *Deleted

## 2016-07-01 ENCOUNTER — Emergency Department (HOSPITAL_COMMUNITY)
Admission: EM | Admit: 2016-07-01 | Discharge: 2016-07-01 | Disposition: A | Discharge status: Home / Self Care | Payer: Self-pay | Attending: Emergency Medicine | Admitting: Emergency Medicine

## 2016-07-01 DIAGNOSIS — F17210 Nicotine dependence, cigarettes, uncomplicated: Secondary | ICD-10-CM | POA: Insufficient documentation

## 2016-07-01 DIAGNOSIS — L0291 Cutaneous abscess, unspecified: Secondary | ICD-10-CM

## 2016-07-01 DIAGNOSIS — J45909 Unspecified asthma, uncomplicated: Secondary | ICD-10-CM | POA: Insufficient documentation

## 2016-07-01 DIAGNOSIS — L02214 Cutaneous abscess of groin: Secondary | ICD-10-CM | POA: Insufficient documentation

## 2016-07-01 MED ORDER — SULFAMETHOXAZOLE-TRIMETHOPRIM 800-160 MG PO TABS: 1.0000 | ORAL_TABLET | Freq: Two times a day (BID) | ORAL | 0 refills | Status: AC

## 2016-07-01 MED ORDER — IBUPROFEN 800 MG PO TABS: 800.0000 mg | ORAL_TABLET | Freq: Three times a day (TID) | ORAL | 0 refills | Status: DC

## 2016-07-01 MED ORDER — SULFAMETHOXAZOLE-TRIMETHOPRIM 800-160 MG PO TABS
1.0000 | ORAL_TABLET | Freq: Once | ORAL | Status: AC
  Administered 2016-07-01: 1 via ORAL
  Filled 2016-07-01: qty 1

## 2016-07-01 MED ORDER — LIDOCAINE-EPINEPHRINE (PF) 2 %-1:200000 IJ SOLN
10.0000 mL | Freq: Once | INTRAMUSCULAR | Status: AC
  Administered 2016-07-01: 10 mL via INTRADERMAL
  Filled 2016-07-01: qty 20

## 2016-07-01 NOTE — ED Provider Notes (Signed)
[facility] DEPT Provider Note   CSN: [id number] Arrival date & time: 07/01/16  1813     History   Chief Complaint Chief Complaint  Patient presents with  . Abscess    HPI Danielle Bonilla is a 45 y.o. female.  HPI   Danielle Bonilla is a 45 y.o. female who presents to the Emergency Department complaining of pain, swelling to her right inner thigh for several hours.  She states she noticed an area that appears to be a "boil".  She has used an OTC boil cream today without relief.  She describes a worsening pain with walking.  She denies fever, abdominal pain, vomiting, tick bite or hx of MRSA  Past Medical History:  Diagnosis Date  . Anemia   . Asthma   . Bronchitis   . Knee pain   . Menorrhagia   . Uterine fibroid     There are no active problems to display for this patient.   Past Surgical History:  Procedure Laterality Date  . ABDOMINAL HYSTERECTOMY    . TUMOR EXCISION      OB History    Gravida Para Term Preterm AB Living   4 4 4          SAB TAB Ectopic Multiple Live Births                   Home Medications    Prior to Admission medications   Medication Sig Start Date End Date Taking? Authorizing Provider  acetaminophen (TYLENOL) 500 MG tablet Take 1,000 mg by mouth daily.    Historical Provider, MD  dexamethasone (DECADRON) 4 MG tablet Take 1 tablet (4 mg total) by mouth 2 (two) times daily with a meal. 03/06/16   Lily Kocher, PA-C  diclofenac (VOLTAREN) 75 MG EC tablet Take 1 tablet (75 mg total) by mouth 2 (two) times daily. 03/06/16   Lily Kocher, PA-C  ibuprofen (ADVIL,MOTRIN) 200 MG tablet Take 800 mg by mouth daily.     Historical Provider, MD  methocarbamol (ROBAXIN) 500 MG tablet Take 1 tablet (500 mg total) by mouth 3 (three) times daily. 03/06/16   Lily Kocher, PA-C    Family History Family History  Problem Relation Age of Onset  . Cancer Father   . Diabetes Father     Social History Social History  Substance Use Topics  .  Smoking status: Current Every Day Smoker    Packs/day: 0.50    Types: Cigarettes  . Smokeless tobacco: Never Used  . Alcohol use No     Allergies   Pineapple and Latex   Review of Systems Review of Systems  Constitutional: Negative for chills and fever.  Gastrointestinal: Negative for nausea and vomiting.  Musculoskeletal: Negative for arthralgias and joint swelling.  Skin: Positive for color change.       Abscess   Hematological: Negative for adenopathy.  All other systems reviewed and are negative.    Physical Exam Updated Vital Signs BP 114/64 (BP Location: Left Arm)   Pulse 74   Temp 98.2 F (36.8 C) (Oral)   Resp 16   Ht 5\' 7"  (1.702 m)   Wt 113.4 kg   SpO2 100%   BMI 39.16 kg/m   Physical Exam  Constitutional: She is oriented to person, place, and time. She appears well-developed and well-nourished. No distress.  HENT:  Head: Normocephalic and atraumatic.  Cardiovascular: Normal rate, regular rhythm and normal heart sounds.   No murmur heard. Pulmonary/Chest: Effort normal and  breath sounds normal. No respiratory distress.  Neurological: She is alert and oriented to person, place, and time. She exhibits normal muscle tone. Coordination normal.  Skin: Skin is warm and dry. There is erythema.  3 cm area of induration to the right groin with two central pustules.  No drainage or significant surrounding erythema.    Nursing note and vitals reviewed.    ED Treatments / Results  Labs (all labs ordered are listed, but only abnormal results are displayed) Labs Reviewed - No data to display  EKG  EKG Interpretation None       Radiology No results found.  Procedures Procedures (including critical care time)  Medications Ordered in ED Medications - No data to display   Initial Impression / Assessment and Plan / ED Course  I have reviewed the triage vital signs and the nursing notes.  Pertinent labs & imaging results that were available during my  care of the patient were reviewed by me and considered in my medical decision making (see chart for details).    INCISION AND DRAINAGE Performed by: Hale Bogus. Consent: Verbal consent obtained. Risks and benefits: risks, benefits and alternatives were discussed Type: abscess  Body area: right groin  Anesthesia: local infiltration  Incision was made with a #11 scalpel.  Local anesthetic: lidocaine 2 % w/ epinephrine  Anesthetic total: 3 ml  Complexity: complex Blunt dissection to break up loculations  Drainage: purulent  Drainage amount: small  Packing material: 1/4 in iodoform gauze  Patient tolerance: Patient tolerated the procedure well with no immediate complications.    Clinical Course    Pt well appearing.  Non-toxic.  Abscess to the right groin.  Pt agrees to abx, compresses and return in 2 days for recheck and packing removal if needed  Final Clinical Impressions(s) / ED Diagnoses   Final diagnoses:  Abscess    New Prescriptions New Prescriptions   No medications on file     Bufford Lope 07/04/16 2139    Isla Pence, MD 07/04/16 [phone]

## 2016-07-01 NOTE — Discharge Instructions (Signed)
Packing removal in 2 days.  Warm water soaks 2-3 times a day after packing is removed.

## 2016-07-01 NOTE — ED Triage Notes (Signed)
Pt comes in with abscess on inner, upper, right thigh. Pt denies any drainage. Denies any fevers, denies n/v/d.

## 2016-11-16 ENCOUNTER — Emergency Department (HOSPITAL_COMMUNITY)
Admission: EM | Admit: 2016-11-16 | Discharge: 2016-11-16 | Disposition: A | Discharge status: Home / Self Care | Payer: Self-pay | Attending: Emergency Medicine | Admitting: Emergency Medicine

## 2016-11-16 ENCOUNTER — Encounter (HOSPITAL_COMMUNITY): Payer: Self-pay | Admitting: Emergency Medicine

## 2016-11-16 DIAGNOSIS — F17210 Nicotine dependence, cigarettes, uncomplicated: Secondary | ICD-10-CM | POA: Insufficient documentation

## 2016-11-16 DIAGNOSIS — J45909 Unspecified asthma, uncomplicated: Secondary | ICD-10-CM | POA: Insufficient documentation

## 2016-11-16 DIAGNOSIS — L239 Allergic contact dermatitis, unspecified cause: Secondary | ICD-10-CM | POA: Insufficient documentation

## 2016-11-16 DIAGNOSIS — N390 Urinary tract infection, site not specified: Secondary | ICD-10-CM | POA: Insufficient documentation

## 2016-11-16 LAB — URINALYSIS, MICROSCOPIC (REFLEX): Squamous Epithelial / LPF: NONE SEEN

## 2016-11-16 LAB — URINALYSIS, ROUTINE W REFLEX MICROSCOPIC
Bilirubin Urine: NEGATIVE
Glucose, UA: NEGATIVE mg/dL
KETONES UR: NEGATIVE mg/dL
NITRITE: NEGATIVE
PROTEIN: NEGATIVE mg/dL
Specific Gravity, Urine: 1.01 (ref 1.005–1.030)
pH: 6.5 (ref 5.0–8.0)

## 2016-11-16 MED ORDER — CEPHALEXIN 500 MG PO CAPS
500.0000 mg | ORAL_CAPSULE | Freq: Once | ORAL | Status: AC
  Administered 2016-11-16: 500 mg via ORAL
  Filled 2016-11-16: qty 1

## 2016-11-16 MED ORDER — TRIAMCINOLONE ACETONIDE 0.1 % EX CREA: 1.0000 "application " | TOPICAL_CREAM | Freq: Three times a day (TID) | CUTANEOUS | 0 refills | Status: DC

## 2016-11-16 MED ORDER — PHENAZOPYRIDINE HCL 200 MG PO TABS: 200.0000 mg | ORAL_TABLET | Freq: Three times a day (TID) | ORAL | 0 refills | Status: DC

## 2016-11-16 MED ORDER — CEPHALEXIN 500 MG PO CAPS: 500.0000 mg | ORAL_CAPSULE | Freq: Four times a day (QID) | ORAL | 0 refills | Status: DC

## 2016-11-16 NOTE — Discharge Instructions (Signed)
Drink plenty of water or cranberry juice.  Take the antibiotic as directed until its finished.  OTC benadryl as directed if needed for the itching.  Return to ER for any worsening symptoms

## 2016-11-16 NOTE — ED Triage Notes (Addendum)
Pt reports lower abd pain and urinary frequency x3 days, denies n/v/d/fever. Pt also wants to be checked for bumps on arms.

## 2016-11-16 NOTE — ED Provider Notes (Signed)
[facility] DEPT Provider Note   CSN: [id number] Arrival date & time: 11/16/16  [id number]     History   Chief Complaint Chief Complaint  Patient presents with  . Abdominal Pain    HPI Danielle Bonilla is a 45 y.o. female.  HPI   Danielle Bonilla is a 45 y.o. female who presents to the Emergency Department complaining of lower abd pain pain for 3 days.  She reports pain with urination, urinary hesitancy and frequency.  She describes a sharp, stabbing pain to her mid lower abdomen that is associated with urination.  She has noticed that her urine appears dark in color and occasional blood post void.  She denies back pain, vomiting, fever, and chills, vaginal discharge or bleeding.  She also complains of itching and rash to both upper arms for greater than one week.  She has recently used a new brand of lotion and noticed "bumps" to her arms.  She has tried rubbing alcohol and cortisone cream without relief.  She denies rash to rest of the body, swelling, difficulty swallowing or shortness of breath.    Past Medical History:  Diagnosis Date  . Anemia   . Asthma   . Bronchitis   . Knee pain   . Menorrhagia   . Uterine fibroid     There are no active problems to display for this patient.   Past Surgical History:  Procedure Laterality Date  . ABDOMINAL HYSTERECTOMY    . TUMOR EXCISION      OB History    Gravida Para Term Preterm AB Living   4 4 4          SAB TAB Ectopic Multiple Live Births                   Home Medications    Prior to Admission medications   Medication Sig Start Date End Date Taking? Authorizing Provider  acetaminophen (TYLENOL) 500 MG tablet Take 1,000 mg by mouth daily.   Yes Historical Provider, MD  ibuprofen (ADVIL,MOTRIN) 800 MG tablet Take 1 tablet (800 mg total) by mouth 3 (three) times daily. Patient not taking: Reported on 11/16/2016 07/01/16   Kem Parkinson, PA-C    Family History Family History  Problem Relation Age of Onset  . Cancer  Father   . Diabetes Father     Social History Social History  Substance Use Topics  . Smoking status: Current Every Day Smoker    Packs/day: 0.50    Types: Cigarettes  . Smokeless tobacco: Never Used  . Alcohol use No     Allergies   Pineapple and Latex   Review of Systems Review of Systems  Constitutional: Negative for activity change, appetite change, chills and fever.  Respiratory: Negative for chest tightness and shortness of breath.   Gastrointestinal: Positive for abdominal pain. Negative for nausea and vomiting.  Genitourinary: Positive for decreased urine volume, dysuria, frequency, hematuria and urgency. Negative for difficulty urinating, flank pain, menstrual problem, vaginal bleeding, vaginal discharge and vaginal pain.  Musculoskeletal: Negative for back pain.  Skin: Positive for rash.  Neurological: Negative for dizziness, weakness and numbness.  Hematological: Negative for adenopathy.  Psychiatric/Behavioral: Negative for confusion.  All other systems reviewed and are negative.    Physical Exam Updated Vital Signs BP 134/89 (BP Location: Left Arm)   Pulse 75   Temp 98.1 F (36.7 C) (Oral)   Resp 18   Ht 5\' 7"  (1.702 m)   Wt 106.6 kg  SpO2 100%   BMI 36.81 kg/m   Physical Exam  Constitutional: She is oriented to person, place, and time. She appears well-developed and well-nourished. No distress.  HENT:  Head: Normocephalic and atraumatic.  Mouth/Throat: Oropharynx is clear and moist.  Neck: Normal range of motion.  Cardiovascular: Normal rate, regular rhythm and intact distal pulses.   No murmur heard. Pulmonary/Chest: Effort normal and breath sounds normal. No respiratory distress. She has no wheezes. She has no rales.  Abdominal: Soft. Normal appearance. She exhibits no distension and no mass. There is no hepatosplenomegaly. There is tenderness in the suprapubic area. There is no rigidity, no rebound, no guarding, no CVA tenderness and no  tenderness at McBurney's point.  Mild ttp of the suprapubic region.  Remaining abdomen is soft, non-tender without guarding or rebound tenderness. No CVA tenderness  Musculoskeletal: Normal range of motion. She exhibits no edema.  Neurological: She is alert and oriented to person, place, and time. Coordination normal.  Skin: Skin is warm and dry. Rash noted.  Mildly erythematous, papular rash to bilateral upper arms. No pustules or vesicles. No edema.  Nursing note and vitals reviewed.    ED Treatments / Results  Labs (all labs ordered are listed, but only abnormal results are displayed) Labs Reviewed  URINALYSIS, ROUTINE W REFLEX MICROSCOPIC - Abnormal; Notable for the following:       Result Value   APPearance CLOUDY (*)    Hgb urine dipstick LARGE (*)    Leukocytes, UA MODERATE (*)    All other components within normal limits  URINALYSIS, MICROSCOPIC (REFLEX) - Abnormal; Notable for the following:    Bacteria, UA MANY (*)    All other components within normal limits  URINE CULTURE    EKG  EKG Interpretation None       Radiology No results found.  Procedures Procedures (including critical care time)  Medications Ordered in ED Medications - No data to display   Initial Impression / Assessment and Plan / ED Course  I have reviewed the triage vital signs and the nursing notes.  Pertinent labs & imaging results that were available during my care of the patient were reviewed by me and considered in my medical decision making (see chart for details).  Clinical Course     Pt is well appearing,  Vitals stable.  No fever, vomiting or CVA tenderness to suggest pyelonephritis.  Doubt stone.  Rx for keflex and pyridium, urine culture pending.  Rx for triamcinolone cream, agrees to [place] benadryl for the itching.  Return precautions given.  Final Clinical Impressions(s) / ED Diagnoses   Final diagnoses:  Lower urinary tract infectious disease  Allergic dermatitis     New Prescriptions New Prescriptions   No medications on file     Kem Parkinson, Hershal Coria 11/16/16 [place], MD 11/16/16 [phone]

## 2016-11-19 LAB — URINE CULTURE: Culture: 80000 — AB

## 2016-11-20 ENCOUNTER — Telehealth (HOSPITAL_BASED_OUTPATIENT_CLINIC_OR_DEPARTMENT_OTHER): Payer: Self-pay

## 2016-11-20 NOTE — Telephone Encounter (Signed)
Post ED Visit - Positive Culture Follow-up  Culture report reviewed by antimicrobial stewardship pharmacist:  []  Elenor Quinones, Pharm.D. [x]  Heide Guile, Pharm.D., BCPS []  Parks Neptune, Pharm.D. []  Alycia Rossetti, Pharm.D., BCPS []  Delleker, Pharm.D., BCPS, AAHIVP []  Legrand Como, Pharm.D., BCPS, AAHIVP []  Milus Glazier, Pharm.D. []  Stephens November, Pharm.D.  Positive urine culture, 80,000 colonies -> Proteus Mirabilis Treated with Cephalexin, organism sensitive to the same and no further patient follow-up is required at this time.  Dortha Kern 11/20/2016, 10:43 AM

## 2017-04-21 ENCOUNTER — Encounter (HOSPITAL_COMMUNITY): Payer: Self-pay | Admitting: Emergency Medicine

## 2017-04-21 ENCOUNTER — Emergency Department (HOSPITAL_COMMUNITY): Payer: Self-pay

## 2017-04-21 ENCOUNTER — Emergency Department (HOSPITAL_COMMUNITY)
Admission: EM | Admit: 2017-04-21 | Discharge: 2017-04-21 | Disposition: A | Discharge status: Home / Self Care | Payer: Self-pay | Attending: Emergency Medicine | Admitting: Emergency Medicine

## 2017-04-21 DIAGNOSIS — F17210 Nicotine dependence, cigarettes, uncomplicated: Secondary | ICD-10-CM | POA: Insufficient documentation

## 2017-04-21 DIAGNOSIS — Y999 Unspecified external cause status: Secondary | ICD-10-CM | POA: Insufficient documentation

## 2017-04-21 DIAGNOSIS — Y929 Unspecified place or not applicable: Secondary | ICD-10-CM | POA: Insufficient documentation

## 2017-04-21 DIAGNOSIS — Y939 Activity, unspecified: Secondary | ICD-10-CM | POA: Insufficient documentation

## 2017-04-21 DIAGNOSIS — S63501A Unspecified sprain of right wrist, initial encounter: Secondary | ICD-10-CM | POA: Insufficient documentation

## 2017-04-21 DIAGNOSIS — J45909 Unspecified asthma, uncomplicated: Secondary | ICD-10-CM | POA: Insufficient documentation

## 2017-04-21 DIAGNOSIS — X58XXXA Exposure to other specified factors, initial encounter: Secondary | ICD-10-CM | POA: Insufficient documentation

## 2017-04-21 DIAGNOSIS — Z79899 Other long term (current) drug therapy: Secondary | ICD-10-CM | POA: Insufficient documentation

## 2017-04-21 MED ORDER — IBUPROFEN 800 MG PO TABS
800.0000 mg | ORAL_TABLET | Freq: Once | ORAL | Status: AC
  Administered 2017-04-21: 800 mg via ORAL
  Filled 2017-04-21: qty 1

## 2017-04-21 MED ORDER — MELOXICAM 15 MG PO TABS: 15.0000 mg | ORAL_TABLET | Freq: Every day | ORAL | 0 refills | Status: DC

## 2017-04-21 NOTE — Discharge Instructions (Signed)
Contact a health care provider if:  Your pain, bruising, or swelling gets worse.  Your skin becomes red, gets a rash, or has open sores.  Your pain does not get better or it gets worse.  Get help right away if:  You have a new or sudden sharp pain in the hand, arm, or wrist.  You have tingling or numbness in your hand.  Your fingers turn white, very red, or cold and blue.  You cannot move your fingers.

## 2017-04-21 NOTE — ED Provider Notes (Signed)
[facility] DEPT Provider Note   CSN: [id number] Arrival date & time: 04/21/17  1957     History   Chief Complaint Chief Complaint  Patient presents with  . Wrist Pain    HPI Danielle Bonilla is a 46 y.o. female who presents emergency Department with chief complaint of right wrist pain. 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 She denies noticing any injury but developed worsening wrist pain, worse on both sides of the wrist with supination, pronation. She rates the pain at 9 out of 10. She denies any numbness, tingling or weakness.  HPI  Past Medical History:  Diagnosis Date  . Anemia   . Asthma   . Bronchitis   . Knee pain   . Menorrhagia   . Uterine fibroid     There are no active problems to display for this patient.   Past Surgical History:  Procedure Laterality Date  . ABDOMINAL HYSTERECTOMY    . TUMOR EXCISION      OB History    Gravida Para Term Preterm AB Living   4 4 4          SAB TAB Ectopic Multiple Live Births                   Home Medications    Prior to Admission medications   Medication Sig Start Date End Date Taking? Authorizing Provider  acetaminophen (TYLENOL) 500 MG tablet Take 1,000 mg by mouth daily.    [provider]  cephALEXin (KEFLEX) 500 MG capsule Take 1 capsule (500 mg total) by mouth 4 (four) times daily. For 7 days 11/16/16   Triplett, Tammy, PA-C  ibuprofen (ADVIL,MOTRIN) 800 MG tablet Take 1 tablet (800 mg total) by mouth 3 (three) times daily. Patient not taking: Reported on 11/16/2016 07/01/16   Kem Parkinson, PA-C  phenazopyridine (PYRIDIUM) 200 MG tablet Take 1 tablet (200 mg total) by mouth 3 (three) times daily. 11/16/16   Triplett, Tammy, PA-C  triamcinolone cream (KENALOG) 0.1 % Apply 1 application topically 3 (three) times daily. 11/16/16   Kem Parkinson, PA-C    Family History Family History  Problem Relation Age of Onset  . Cancer Father   . Diabetes Father     Social  History Social History  Substance Use Topics  . Smoking status: Current Every Day Smoker    Packs/day: 0.50    Types: Cigarettes  . Smokeless tobacco: Never Used  . Alcohol use No     Allergies   Pineapple and Latex   Review of Systems Review of Systems  Constitutional: Negative for chills and fever.  Musculoskeletal: Positive for arthralgias.  Skin: Negative for color change and wound.  Neurological: Negative for weakness.     Physical Exam Updated Vital Signs BP 114/69   Pulse 69   Temp 98.4 F (36.9 C)   Resp 20   Ht 5\' 7"  (1.702 m)   Wt 106.6 kg (235 lb)   SpO2 99%   BMI 36.81 kg/m   Physical Exam  Constitutional: She is oriented to person, place, and time. She appears well-developed and well-nourished. No distress.  HENT:  Head: Normocephalic and atraumatic.  Eyes: Conjunctivae are normal. No scleral icterus.  Neck: Normal range of motion.  Cardiovascular: Normal rate, regular rhythm and normal heart sounds.  Exam reveals no gallop and no friction rub.   No murmur heard. Pulmonary/Chest: Effort normal and breath sounds normal. No respiratory distress.  Abdominal: Soft. Bowel sounds are normal.  She exhibits no distension and no mass. There is no tenderness. There is no guarding.  Musculoskeletal:  Right wrist is tender to palpation bilaterally, pain with ulnar and radial deviation, supination and pronation, flexion and extension, no swelling, normal grip strength, normal radial pulse, negative Tinel's and Phalen's, no tenderness to the scaphoid bone  Neurological: She is alert and oriented to person, place, and time.  Skin: Skin is warm and dry. She is not diaphoretic.  Psychiatric: Her behavior is normal.  Nursing note and vitals reviewed.    ED Treatments / Results  Labs (all labs ordered are listed, but only abnormal results are displayed) Labs Reviewed - No data to display  EKG  EKG Interpretation None       Radiology No results  found.  Procedures Procedures (including critical care time)  Medications Ordered in ED Medications - No data to display   Initial Impression / Assessment and Plan / ED Course  I have reviewed the triage vital signs and the nursing notes.  Pertinent labs & imaging results that were available during my care of the patient were reviewed by me and considered in my medical decision making (see chart for details).     Patient X-Ray negative for obvious fracture or dislocation. Pain managed in ED. Pt advised to follow up with orthopedics if symptoms persist for possibility of missed fracture diagnosis. Patient given brace while in ED, conservative therapy recommended and discussed. Patient will be dc home & is agreeable with above plan.   Final Clinical Impressions(s) / ED Diagnoses   Final diagnoses:  Sprain of right wrist, initial encounter    New Prescriptions New Prescriptions   No medications on file     Ned Grace 04/21/17 2128    Julianne Rice, MD 04/23/17 [phone]

## 2017-04-21 NOTE — ED Triage Notes (Signed)
Pt c/o right wrist pain that started today and denies any injury.

## 2017-08-24 ENCOUNTER — Emergency Department (HOSPITAL_COMMUNITY)
Admission: EM | Admit: 2017-08-24 | Discharge: 2017-08-24 | Disposition: A | Discharge status: Home / Self Care | Payer: Self-pay | Attending: Emergency Medicine | Admitting: Emergency Medicine

## 2017-08-24 ENCOUNTER — Encounter (HOSPITAL_COMMUNITY): Payer: Self-pay | Admitting: Emergency Medicine

## 2017-08-24 DIAGNOSIS — S39012A Strain of muscle, fascia and tendon of lower back, initial encounter: Secondary | ICD-10-CM | POA: Insufficient documentation

## 2017-08-24 DIAGNOSIS — F17210 Nicotine dependence, cigarettes, uncomplicated: Secondary | ICD-10-CM | POA: Insufficient documentation

## 2017-08-24 DIAGNOSIS — Z91040 Latex allergy status: Secondary | ICD-10-CM | POA: Insufficient documentation

## 2017-08-24 DIAGNOSIS — Z79899 Other long term (current) drug therapy: Secondary | ICD-10-CM | POA: Insufficient documentation

## 2017-08-24 DIAGNOSIS — X500XXA Overexertion from strenuous movement or load, initial encounter: Secondary | ICD-10-CM | POA: Insufficient documentation

## 2017-08-24 DIAGNOSIS — Y999 Unspecified external cause status: Secondary | ICD-10-CM | POA: Insufficient documentation

## 2017-08-24 DIAGNOSIS — Y929 Unspecified place or not applicable: Secondary | ICD-10-CM | POA: Insufficient documentation

## 2017-08-24 DIAGNOSIS — Y9389 Activity, other specified: Secondary | ICD-10-CM | POA: Insufficient documentation

## 2017-08-24 DIAGNOSIS — D649 Anemia, unspecified: Secondary | ICD-10-CM | POA: Insufficient documentation

## 2017-08-24 DIAGNOSIS — J45909 Unspecified asthma, uncomplicated: Secondary | ICD-10-CM | POA: Insufficient documentation

## 2017-08-24 LAB — URINALYSIS, ROUTINE W REFLEX MICROSCOPIC
BACTERIA UA: NONE SEEN
BILIRUBIN URINE: NEGATIVE
GLUCOSE, UA: NEGATIVE mg/dL
HGB URINE DIPSTICK: NEGATIVE
Ketones, ur: NEGATIVE mg/dL
LEUKOCYTES UA: NEGATIVE
NITRITE: NEGATIVE
Protein, ur: 30 mg/dL — AB
SPECIFIC GRAVITY, URINE: 1.027 (ref 1.005–1.030)
pH: 5 (ref 5.0–8.0)

## 2017-08-24 MED ORDER — IBUPROFEN 800 MG PO TABS
800.0000 mg | ORAL_TABLET | Freq: Once | ORAL | Status: AC
  Administered 2017-08-24: 800 mg via ORAL
  Filled 2017-08-24: qty 1

## 2017-08-24 MED ORDER — CYCLOBENZAPRINE HCL 10 MG PO TABS
10.0000 mg | ORAL_TABLET | Freq: Once | ORAL | Status: AC
  Administered 2017-08-24: 10 mg via ORAL
  Filled 2017-08-24: qty 1

## 2017-08-24 MED ORDER — CYCLOBENZAPRINE HCL 10 MG PO TABS: 10.0000 mg | ORAL_TABLET | Freq: Three times a day (TID) | ORAL | 0 refills | Status: DC | PRN

## 2017-08-24 MED ORDER — NAPROXEN 500 MG PO TABS: 500.0000 mg | ORAL_TABLET | Freq: Two times a day (BID) | ORAL | 0 refills | Status: DC

## 2017-08-24 NOTE — Discharge Instructions (Signed)
Alternate ice and heat to your back. Avoid twisting, bending or heavy lifting. Follow up with your primary doctor or return here for any worsening symptoms.

## 2017-08-24 NOTE — ED Provider Notes (Signed)
[facility] DEPT Provider Note   CSN: [id number] Arrival date & time: 08/24/17  1851     History   Chief Complaint Chief Complaint  Patient presents with  . Back Pain    HPI Danielle Bonilla is a 46 y.o. female.  HPI   Danielle Bonilla is a 47 y.o. female who presents to the Emergency Department complaining of Right-sided low back pain for 3 days. She describes an aching pain to the right side of her back is worse with certain movements. Pain improves at rest. 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 She also reports some urinary frequency without burning or hesistancy. She denies known injury, pain, numbness, weakness of lower extremities, abdominal pain, fever, or bowel changes. She has tried Tylenol without relief.     Past Medical History:  Diagnosis Date  . Anemia   . Asthma   . Bronchitis   . Knee pain   . Menorrhagia   . Uterine fibroid     There are no active problems to display for this patient.   Past Surgical History:  Procedure Laterality Date  . ABDOMINAL HYSTERECTOMY    . TUMOR EXCISION      OB History    Gravida Para Term Preterm AB Living   4 4 4          SAB TAB Ectopic Multiple Live Births                   Home Medications    Prior to Admission medications   Medication Sig Start Date End Date Taking? Authorizing Provider  acetaminophen (TYLENOL) 500 MG tablet Take 1,000 mg by mouth daily.    [provider]  cephALEXin (KEFLEX) 500 MG capsule Take 1 capsule (500 mg total) by mouth 4 (four) times daily. For 7 days 11/16/16   Quinterrius Errington, PA-C  ibuprofen (ADVIL,MOTRIN) 800 MG tablet Take 1 tablet (800 mg total) by mouth 3 (three) times daily. Patient not taking: Reported on 11/16/2016 07/01/16   Kem Parkinson, PA-C  meloxicam (MOBIC) 15 MG tablet Take 1 tablet (15 mg total) by mouth daily. 04/21/17   Margarita Mail, PA-C  phenazopyridine (PYRIDIUM) 200 MG tablet Take 1 tablet (200 mg total) by mouth 3  (three) times daily. 11/16/16   Celest Reitz, PA-C  triamcinolone cream (KENALOG) 0.1 % Apply 1 application topically 3 (three) times daily. 11/16/16   Kem Parkinson, PA-C    Family History Family History  Problem Relation Age of Onset  . Cancer Father   . Diabetes Father     Social History Social History  Substance Use Topics  . Smoking status: Current Every Day Smoker    Packs/day: 0.50    Types: Cigarettes  . Smokeless tobacco: Never Used  . Alcohol use No     Allergies   Pineapple and Latex   Review of Systems Review of Systems  Constitutional: Negative for fever.  Respiratory: Negative for shortness of breath.   Gastrointestinal: Negative for abdominal pain, constipation and vomiting.  Genitourinary: Negative for decreased urine volume, difficulty urinating, dysuria, flank pain and hematuria.  Musculoskeletal: Positive for back pain. Negative for joint swelling.  Skin: Negative for rash.  Neurological: Negative for weakness and numbness.  All other systems reviewed and are negative.    Physical Exam Updated Vital Signs BP 128/75   Pulse 87   Temp 98.2 F (36.8 C) (Oral)   Resp 18   Ht 5\' 7"  (1.702 m)  Wt 106.6 kg (235 lb)   SpO2 98%   BMI 36.81 kg/m   Physical Exam  Constitutional: She is oriented to person, place, and time. She appears well-developed and well-nourished. No distress.  HENT:  Head: Normocephalic and atraumatic.  Neck: Normal range of motion. Neck supple.  Cardiovascular: Normal rate, regular rhythm and intact distal pulses.   No murmur heard. Pulmonary/Chest: Effort normal and breath sounds normal. No respiratory distress.  Abdominal: Soft. She exhibits no distension. There is no tenderness. There is no guarding.  Musculoskeletal: She exhibits tenderness. She exhibits no edema.       Lumbar back: She exhibits tenderness and pain. She exhibits normal range of motion, no swelling, no deformity, no laceration and normal pulse.    ttp of the right lumbar paraspinal muscles.  No spinal tenderness.  DP pulses are brisk and symmetrical.  Distal sensation intact.  positive SLR on right at 20 degrees.  Pt has 5/5 strength against resistance of bilateral lower extremities.     Neurological: She is alert and oriented to person, place, and time. She has normal strength. No sensory deficit. She exhibits normal muscle tone. Coordination and gait normal.  Reflex Scores:      Patellar reflexes are 2+ on the right side and 2+ on the left side.      Achilles reflexes are 2+ on the right side and 2+ on the left side. Skin: Skin is warm and dry. Capillary refill takes less than 2 seconds. No rash noted.  Psychiatric: She has a normal mood and affect.  Nursing note and vitals reviewed.    ED Treatments / Results  Labs (all labs ordered are listed, but only abnormal results are displayed) Labs Reviewed  URINALYSIS, ROUTINE W REFLEX MICROSCOPIC - Abnormal; Notable for the following:       Result Value   Protein, ur 30 (*)    Squamous Epithelial / LPF 0-5 (*)    All other components within normal limits    EKG  EKG Interpretation None       Radiology No results found.  Procedures Procedures (including critical care time)  Medications Ordered in ED Medications  cyclobenzaprine (FLEXERIL) tablet 10 mg (not administered)  ibuprofen (ADVIL,MOTRIN) tablet 800 mg (not administered)     Initial Impression / Assessment and Plan / ED Course  I have reviewed the triage vital signs and the nursing notes.  Pertinent labs & imaging results that were available during my care of the patient were reviewed by me and considered in my medical decision making (see chart for details).     Patient well-appearing. Ambulates with a steady gait. No focal neuro deficits on exam. Pain is likely musculoskeletal. No concerning symptoms for spinal abscess or cauda equina. She appears stable for discharge and agrees to symptomatic treatment  and PCP follow-up if needed.  Final Clinical Impressions(s) / ED Diagnoses   Final diagnoses:  Strain of lumbar region, initial encounter    New Prescriptions New Prescriptions   No medications on file     Kem Parkinson, Hershal Coria 08/24/17 2032    Kem Parkinson, PA-C 08/24/17 2033    Noemi Chapel, MD 08/25/17 2124

## 2017-08-24 NOTE — ED Triage Notes (Signed)
Pt c/o lower back pain x 3 days with dysuria.

## 2018-04-27 ENCOUNTER — Emergency Department (HOSPITAL_COMMUNITY)
Admission: EM | Admit: 2018-04-27 | Discharge: 2018-04-27 | Disposition: A | Discharge status: Home / Self Care | Payer: Self-pay | Attending: Emergency Medicine | Admitting: Emergency Medicine

## 2018-04-27 ENCOUNTER — Encounter (HOSPITAL_COMMUNITY): Payer: Self-pay | Admitting: *Deleted

## 2018-04-27 ENCOUNTER — Emergency Department (HOSPITAL_COMMUNITY): Payer: Self-pay

## 2018-04-27 ENCOUNTER — Other Ambulatory Visit: Payer: Self-pay

## 2018-04-27 DIAGNOSIS — B9789 Other viral agents as the cause of diseases classified elsewhere: Secondary | ICD-10-CM | POA: Insufficient documentation

## 2018-04-27 DIAGNOSIS — F17210 Nicotine dependence, cigarettes, uncomplicated: Secondary | ICD-10-CM | POA: Insufficient documentation

## 2018-04-27 DIAGNOSIS — J45909 Unspecified asthma, uncomplicated: Secondary | ICD-10-CM | POA: Insufficient documentation

## 2018-04-27 DIAGNOSIS — Z79899 Other long term (current) drug therapy: Secondary | ICD-10-CM | POA: Insufficient documentation

## 2018-04-27 DIAGNOSIS — J069 Acute upper respiratory infection, unspecified: Secondary | ICD-10-CM | POA: Insufficient documentation

## 2018-04-27 MED ORDER — BENZONATATE 200 MG PO CAPS: 200.0000 mg | ORAL_CAPSULE | Freq: Three times a day (TID) | ORAL | 0 refills | Status: DC | PRN

## 2018-04-27 MED ORDER — ALBUTEROL SULFATE HFA 108 (90 BASE) MCG/ACT IN AERS
2.0000 | INHALATION_SPRAY | Freq: Once | RESPIRATORY_TRACT | Status: AC
  Administered 2018-04-27: 2 via RESPIRATORY_TRACT
  Filled 2018-04-27: qty 6.7

## 2018-04-27 MED ORDER — PSEUDOEPHEDRINE HCL 60 MG PO TABS: 60.0000 mg | ORAL_TABLET | Freq: Four times a day (QID) | ORAL | 0 refills | Status: DC | PRN

## 2018-04-27 MED ORDER — PREDNISONE 20 MG PO TABS: 40.0000 mg | ORAL_TABLET | Freq: Every day | ORAL | 0 refills | Status: DC

## 2018-04-27 MED ORDER — IPRATROPIUM-ALBUTEROL 0.5-2.5 (3) MG/3ML IN SOLN
3.0000 mL | Freq: Once | RESPIRATORY_TRACT | Status: AC
  Administered 2018-04-27: 3 mL via RESPIRATORY_TRACT
  Filled 2018-04-27: qty 3

## 2018-04-27 NOTE — ED Notes (Signed)
ED Provider at bedside. 

## 2018-04-27 NOTE — Discharge Instructions (Addendum)
1 to 2 puffs of the inhaler every 4-6 hours as needed.  Drink plenty of fluids while taking your medication.  Follow-up with your primary doctor or return here for any worsening symptoms.

## 2018-04-27 NOTE — ED Notes (Signed)
Patient transported to X-ray 

## 2018-04-27 NOTE — ED Provider Notes (Signed)
[facility] EMERGENCY DEPARTMENT Provider Note   CSN: [id number] Arrival date & time: 04/27/18  1728     History   Chief Complaint Chief Complaint  Patient presents with  . Cough    HPI Danielle Bonilla is a 47 y.o. female.   HPI   Danielle Bonilla is a 47 y.o. female who presents to the Emergency Department complaining of nasal congestion, runny nose, cough for several days.  Noticed "soreness" to bilateral lower ribs one day ago.  Soreness is associated with cough only.  Reports intermittent wheezing and congestion worse with lying down.  Denies shortness of breath, fever, vomiting, sore throat and ear pain.  Has been taking OTC cold medications without relief.     Past Medical History:  Diagnosis Date  . Anemia   . Asthma   . Bronchitis   . Knee pain   . Menorrhagia   . Uterine fibroid     There are no active problems to display for this patient.   Past Surgical History:  Procedure Laterality Date  . ABDOMINAL HYSTERECTOMY    . TUMOR EXCISION       OB History    Gravida  4   Para  4   Term  4   Preterm      AB      Living        SAB      TAB      Ectopic      Multiple      Live Births               Home Medications    Prior to Admission medications   Medication Sig Start Date End Date Taking? Authorizing Provider  acetaminophen (TYLENOL) 500 MG tablet Take 1,000 mg by mouth daily.    [provider]  cephALEXin (KEFLEX) 500 MG capsule Take 1 capsule (500 mg total) by mouth 4 (four) times daily. For 7 days 11/16/16   Leahmarie Gasiorowski, PA-C  cyclobenzaprine (FLEXERIL) 10 MG tablet Take 1 tablet (10 mg total) by mouth 3 (three) times daily as needed. 08/24/17   Vernetta Dizdarevic, PA-C  ibuprofen (ADVIL,MOTRIN) 800 MG tablet Take 1 tablet (800 mg total) by mouth 3 (three) times daily. Patient not taking: Reported on 11/16/2016 07/01/16   Kem Parkinson, PA-C  meloxicam (MOBIC) 15 MG tablet Take 1 tablet (15 mg total) by mouth daily.  04/21/17   Margarita Mail, PA-C  naproxen (NAPROSYN) 500 MG tablet Take 1 tablet (500 mg total) by mouth 2 (two) times daily with a meal. 08/24/17   Tasha Jindra, PA-C  phenazopyridine (PYRIDIUM) 200 MG tablet Take 1 tablet (200 mg total) by mouth 3 (three) times daily. 11/16/16   Jencarlos Nicolson, PA-C  triamcinolone cream (KENALOG) 0.1 % Apply 1 application topically 3 (three) times daily. 11/16/16   Kem Parkinson, PA-C    Family History Family History  Problem Relation Age of Onset  . Cancer Father   . Diabetes Father     Social History Social History   Tobacco Use  . Smoking status: Current Every Day Smoker    Packs/day: 0.50    Types: Cigarettes  . Smokeless tobacco: Never Used  Substance Use Topics  . Alcohol use: No  . Drug use: No     Allergies   Pineapple and Latex   Review of Systems Review of Systems  Constitutional: Negative for appetite change, chills and fever.  HENT: Positive for congestion. Negative for sore throat  and trouble swallowing.   Respiratory: Positive for cough and chest tightness. Negative for shortness of breath and wheezing.   Cardiovascular: Negative for chest pain.  Gastrointestinal: Negative for abdominal pain, nausea and vomiting.  Genitourinary: Negative for dysuria.  Musculoskeletal: Negative for arthralgias.  Skin: Negative for rash.  Neurological: Negative for dizziness, weakness and numbness.  Hematological: Negative for adenopathy.  All other systems reviewed and are negative.    Physical Exam Updated Vital Signs BP 134/72 (BP Location: Right Arm)   Pulse 81   Temp 98 F (36.7 C) (Oral)   Resp 20   Ht 5\' 7"  (1.702 m)   Wt 106.6 kg (235 lb)   SpO2 97%   BMI 36.81 kg/m   Physical Exam  Constitutional: She appears well-developed and well-nourished. No distress.  HENT:  Head: Normocephalic and atraumatic.  Right Ear: Tympanic membrane and ear canal normal.  Left Ear: Tympanic membrane and ear canal normal.  Nose:  Mucosal edema and rhinorrhea present.  Mouth/Throat: Uvula is midline, oropharynx is clear and moist and mucous membranes are normal. No oropharyngeal exudate.  Eyes: Pupils are equal, round, and reactive to light. EOM are normal.  Neck: Normal range of motion, full passive range of motion without pain and phonation normal. Neck supple.  Cardiovascular: Normal rate, regular rhythm and intact distal pulses.  No murmur heard. Pulmonary/Chest: Effort normal. No stridor. No respiratory distress. She has wheezes. She has no rales. She exhibits no tenderness (mild ttp of the lower ribs bilaterally.  no crepitus.  ).  Lung sounds slightly diminished bilaterally with faint expiratory wheezes on the right.  No rales.  Patient able to speak in full and complete sentences without respiratory difficulty  Abdominal: Soft. She exhibits no distension and no mass. There is no tenderness. There is no guarding.  Musculoskeletal: Normal range of motion. She exhibits no edema.  Lymphadenopathy:    She has no cervical adenopathy.  Neurological: She is alert. No sensory deficit.  Skin: Skin is warm and dry.  Psychiatric: She has a normal mood and affect.  Nursing note and vitals reviewed.    ED Treatments / Results  Labs (all labs ordered are listed, but only abnormal results are displayed) Labs Reviewed - No data to display  EKG EKG Interpretation  Date/Time:  Tuesday April 27 2018 17:35:38 EDT Ventricular Rate:  82 PR Interval:  128 QRS Duration: 76 QT Interval:  372 QTC Calculation: 434 R Axis:   37 Text Interpretation:  Normal sinus rhythm Normal ECG No significant change since last tracing Confirmed by Dorie Rank [phone]) on 04/27/2018 5:49:25 PM   Radiology Dg Chest 2 View  Result Date: 04/27/2018 CLINICAL DATA:  Dry cough x3 days EXAM: CHEST - 2 VIEW COMPARISON:  None. FINDINGS: The heart size and mediastinal contours are within normal limits. Both lungs are clear. The visualized skeletal  structures are unremarkable. Bilateral nipple piercings. IMPRESSION: No active cardiopulmonary disease. Electronically Signed   By: Ashley Royalty M.D.   On: 04/27/2018 18:06    Procedures Procedures (including critical care time)  Medications Ordered in ED Medications - No data to display   Initial Impression / Assessment and Plan / ED Course  I have reviewed the triage vital signs and the nursing notes.  Pertinent labs & imaging results that were available during my care of the patient were reviewed by me and considered in my medical decision making (see chart for details).    1900  On recheck, lung sounds improved.  Pt well appearing, vitals reviewed, PERC negative.  Sx's are felt to be viral.  Pt reports feeling better and ready for d/c, return precautions discussed.   Final Clinical Impressions(s) / ED Diagnoses   Final diagnoses:  Viral URI with cough    ED Discharge Orders    None       Kem Parkinson, PA-C 04/30/18 0016    Francine Graven, DO 05/01/18 2339

## 2018-04-27 NOTE — ED Triage Notes (Addendum)
Pt c/o green productive cough x 2-3 days with soreness to lower chest with coughing and movement that started today. Pt says the pain is underneath breasts/rib cage on both sides.

## 2018-04-27 NOTE — ED Notes (Signed)
Pt returned from xray

## 2018-05-04 ENCOUNTER — Emergency Department (HOSPITAL_COMMUNITY)
Admission: EM | Admit: 2018-05-04 | Discharge: 2018-05-04 | Disposition: A | Discharge status: Home / Self Care | Payer: Self-pay | Attending: Emergency Medicine | Admitting: Emergency Medicine

## 2018-05-04 ENCOUNTER — Encounter (HOSPITAL_COMMUNITY): Payer: Self-pay | Admitting: *Deleted

## 2018-05-04 ENCOUNTER — Other Ambulatory Visit: Payer: Self-pay

## 2018-05-04 DIAGNOSIS — Z91040 Latex allergy status: Secondary | ICD-10-CM | POA: Insufficient documentation

## 2018-05-04 DIAGNOSIS — F17210 Nicotine dependence, cigarettes, uncomplicated: Secondary | ICD-10-CM | POA: Insufficient documentation

## 2018-05-04 DIAGNOSIS — Z139 Encounter for screening, unspecified: Secondary | ICD-10-CM | POA: Insufficient documentation

## 2018-05-04 DIAGNOSIS — J45909 Unspecified asthma, uncomplicated: Secondary | ICD-10-CM | POA: Insufficient documentation

## 2018-05-04 NOTE — ED Triage Notes (Signed)
Pt here for recheck. Pt here on 6/11/9 for bronchitis per pt. Pt c/o nasal congestion, green productive cough since she was last here. Pt reports when she was here on 04/27/18 she was given 10lb lifting restriction on her work note and she hasn't been able to return to work because of this. Pt requesting work note with no restrictions saying she is clear to work.

## 2018-05-04 NOTE — Discharge Instructions (Addendum)
You may contact 1 of the clinics listed to establish primary care.  Continue to use your albuterol inhaler as needed.  You may also want to try over-the-counter Zyrtec as directed for at least 2 weeks.

## 2018-05-06 NOTE — ED Provider Notes (Signed)
[facility] EMERGENCY DEPARTMENT Provider Note   CSN: [id number] Arrival date & time: 05/04/18  1137     History   Chief Complaint Chief Complaint  Patient presents with  . Follow-up    HPI Danielle Bonilla is a 47 y.o. female.  HPI  Danielle Bonilla is a 47 y.o. female who presents to the Emergency Department requesting a return to work note.  She was seen here on 04/27/18 by me and treated for bronchitis.  She was given a work restriction note and today she is requesting a note to return ot work.  Reports that she is feeling better.   Past Medical History:  Diagnosis Date  . Anemia   . Asthma   . Bronchitis   . Knee pain   . Menorrhagia   . Uterine fibroid     There are no active problems to display for this patient.   Past Surgical History:  Procedure Laterality Date  . ABDOMINAL HYSTERECTOMY    . TUMOR EXCISION       OB History    Gravida  4   Para  4   Term  4   Preterm      AB      Living        SAB      TAB      Ectopic      Multiple      Live Births               Home Medications    Prior to Admission medications   Medication Sig Start Date End Date Taking? Authorizing Provider  albuterol (PROVENTIL HFA;VENTOLIN HFA) 108 (90 Base) MCG/ACT inhaler Inhale 2 puffs into the lungs every 6 (six) hours as needed for wheezing or shortness of breath.   Yes [provider]  benzonatate (TESSALON) 200 MG capsule Take 1 capsule (200 mg total) by mouth 3 (three) times daily as needed for cough. Swallow whole, do not chew 04/27/18  Yes Jaiyanna Safran, PA-C  guaifenesin (ROBITUSSIN) 100 MG/5ML syrup Take 200 mg by mouth 3 (three) times daily as needed for cough.   Yes [provider]  predniSONE (DELTASONE) 20 MG tablet Take 2 tablets (40 mg total) by mouth daily. 04/27/18  Yes Johnathon Mittal, PA-C  pseudoephedrine (SUDAFED) 60 MG tablet Take 1 tablet (60 mg total) by mouth every 6 (six) hours as needed for congestion. 04/27/18    Kem Parkinson, PA-C    Family History Family History  Problem Relation Age of Onset  . Cancer Father   . Diabetes Father     Social History Social History   Tobacco Use  . Smoking status: Current Every Day Smoker    Packs/day: 0.50    Types: Cigarettes  . Smokeless tobacco: Never Used  Substance Use Topics  . Alcohol use: No  . Drug use: No     Allergies   Pineapple and Latex   Review of Systems Review of Systems  Constitutional: Negative for appetite change and fever.  HENT: Negative for congestion.   Respiratory: Negative for cough, chest tightness and shortness of breath.   Cardiovascular: Negative for chest pain.  Musculoskeletal: Negative for myalgias.  Neurological: Negative for headaches.     Physical Exam Updated Vital Signs BP 110/70 (BP Location: Right Arm)   Pulse 75   Temp 98.6 F (37 C) (Oral)   Resp 16   Ht 5\' 7"  (1.702 m)   Wt 106.6 kg (235 lb)  SpO2 99%   BMI 36.81 kg/m   Physical Exam  Constitutional: She is oriented to person, place, and time. She appears well-nourished. No distress.  HENT:  Head: Normocephalic.  Mouth/Throat: Oropharynx is clear and moist.  Neck: No Kernig's sign noted.  Cardiovascular: Normal rate and regular rhythm.  Pulmonary/Chest: Effort normal and breath sounds normal. She has no wheezes.  Abdominal: Normal appearance.  Musculoskeletal: Normal range of motion.  Neurological: She is alert and oriented to person, place, and time.  Skin: Skin is warm.  Psychiatric: She has a normal mood and affect.  Nursing note and vitals reviewed.    ED Treatments / Results  Labs (all labs ordered are listed, but only abnormal results are displayed) Labs Reviewed - No data to display  EKG None  Radiology No results found.  Procedures Procedures (including critical care time)  Medications Ordered in ED Medications - No data to display   Initial Impression / Assessment and Plan / ED Course  I have  reviewed the triage vital signs and the nursing notes.  Pertinent labs & imaging results that were available during my care of the patient were reviewed by me and considered in my medical decision making (see chart for details).    Patient here for return to work note.  Denies symptoms reports feeling better.  Return note provided  Final Clinical Impressions(s) / ED Diagnoses   Final diagnoses:  Encounter for medical screening examination    ED Discharge Orders    None       Kem Parkinson, PA-C 05/06/18 1420    Sherwood Gambler, MD 05/08/18 [phone]

## 2018-08-04 ENCOUNTER — Other Ambulatory Visit: Payer: Self-pay | Admitting: Obstetrics and Gynecology

## 2018-08-04 DIAGNOSIS — Z1231 Encounter for screening mammogram for malignant neoplasm of breast: Secondary | ICD-10-CM

## 2018-08-19 ENCOUNTER — Ambulatory Visit (HOSPITAL_COMMUNITY): Payer: Self-pay

## 2018-11-30 ENCOUNTER — Other Ambulatory Visit: Payer: Self-pay

## 2018-11-30 ENCOUNTER — Emergency Department (HOSPITAL_COMMUNITY)
Admission: EM | Admit: 2018-11-30 | Discharge: 2018-11-30 | Disposition: A | Discharge status: Home / Self Care | Payer: Self-pay | Attending: Emergency Medicine | Admitting: Emergency Medicine

## 2018-11-30 ENCOUNTER — Emergency Department (HOSPITAL_COMMUNITY): Payer: Self-pay

## 2018-11-30 ENCOUNTER — Encounter (HOSPITAL_COMMUNITY): Payer: Self-pay | Admitting: Emergency Medicine

## 2018-11-30 DIAGNOSIS — Z79899 Other long term (current) drug therapy: Secondary | ICD-10-CM | POA: Insufficient documentation

## 2018-11-30 DIAGNOSIS — F17210 Nicotine dependence, cigarettes, uncomplicated: Secondary | ICD-10-CM | POA: Insufficient documentation

## 2018-11-30 DIAGNOSIS — J45909 Unspecified asthma, uncomplicated: Secondary | ICD-10-CM | POA: Insufficient documentation

## 2018-11-30 DIAGNOSIS — R1084 Generalized abdominal pain: Secondary | ICD-10-CM | POA: Insufficient documentation

## 2018-11-30 LAB — LIPASE, BLOOD: Lipase: 32 U/L (ref 11–51)

## 2018-11-30 LAB — COMPREHENSIVE METABOLIC PANEL
ALT: 13 U/L (ref 0–44)
AST: 13 U/L — ABNORMAL LOW (ref 15–41)
Albumin: 4.1 g/dL (ref 3.5–5.0)
Alkaline Phosphatase: 37 U/L — ABNORMAL LOW (ref 38–126)
Anion gap: 8 (ref 5–15)
BUN: 14 mg/dL (ref 6–20)
CO2: 21 mmol/L — ABNORMAL LOW (ref 22–32)
Calcium: 9 mg/dL (ref 8.9–10.3)
Chloride: 108 mmol/L (ref 98–111)
Creatinine, Ser: 0.82 mg/dL (ref 0.44–1.00)
GFR calc Af Amer: >60 mL/min (ref >60)
GFR calc non Af Amer: >60 mL/min (ref >60)
Glucose, Bld: 91 mg/dL (ref 70–99)
Potassium: 3.8 mmol/L (ref 3.5–5.1)
Sodium: 137 mmol/L (ref 135–145)
Total Bilirubin: 0.3 mg/dL (ref 0.3–1.2)
Total Protein: 7.6 g/dL (ref 6.5–8.1)

## 2018-11-30 LAB — URINALYSIS, ROUTINE W REFLEX MICROSCOPIC
Bilirubin Urine: NEGATIVE
Glucose, UA: NEGATIVE mg/dL
Hgb urine dipstick: NEGATIVE
Ketones, ur: NEGATIVE mg/dL
Leukocytes, UA: NEGATIVE
Nitrite: NEGATIVE
Protein, ur: NEGATIVE mg/dL
Specific Gravity, Urine: 1.025 (ref 1.005–1.030)
pH: 5 (ref 5.0–8.0)

## 2018-11-30 LAB — CBC
HCT: 37.4 % (ref 36.0–46.0)
Hemoglobin: 11.4 g/dL — ABNORMAL LOW (ref 12.0–15.0)
MCH: 26 pg (ref 26.0–34.0)
MCHC: 30.5 g/dL (ref 30.0–36.0)
MCV: 85.4 fL (ref 80.0–100.0)
Platelets: 402 10*3/uL — ABNORMAL HIGH (ref 150–400)
RBC: 4.38 MIL/uL (ref 3.87–5.11)
RDW: 15.2 % (ref 11.5–15.5)
WBC: 6.9 10*3/uL (ref 4.0–10.5)
nRBC: 0 % (ref 0.0–0.2)

## 2018-11-30 MED ORDER — ONDANSETRON HCL 4 MG PO TABS: 4.0000 mg | ORAL_TABLET | Freq: Four times a day (QID) | ORAL | 0 refills | Status: DC

## 2018-11-30 MED ORDER — LACTATED RINGERS IV BOLUS
1000.0000 mL | Freq: Once | INTRAVENOUS | Status: AC
  Administered 2018-11-30: 1000 mL via INTRAVENOUS

## 2018-11-30 MED ORDER — DICYCLOMINE HCL 20 MG PO TABS: 20.0000 mg | ORAL_TABLET | Freq: Four times a day (QID) | ORAL | 0 refills | Status: DC | PRN

## 2018-11-30 MED ORDER — ONDANSETRON HCL 4 MG/2ML IJ SOLN
4.0000 mg | Freq: Once | INTRAMUSCULAR | Status: AC
  Administered 2018-11-30: 4 mg via INTRAVENOUS
  Filled 2018-11-30: qty 2

## 2018-11-30 MED ORDER — IOPAMIDOL (ISOVUE-300) INJECTION 61%
100.0000 mL | Freq: Once | INTRAVENOUS | Status: AC | PRN
  Administered 2018-11-30: 100 mL via INTRAVENOUS

## 2018-11-30 MED ORDER — KETOROLAC TROMETHAMINE 30 MG/ML IJ SOLN
10.0000 mg | Freq: Once | INTRAMUSCULAR | Status: AC
  Administered 2018-11-30: 9.9 mg via INTRAVENOUS
  Filled 2018-11-30: qty 1

## 2018-11-30 MED ORDER — MORPHINE SULFATE (PF) 4 MG/ML IV SOLN
8.0000 mg | Freq: Once | INTRAVENOUS | Status: AC
  Administered 2018-11-30: 8 mg via INTRAVENOUS
  Filled 2018-11-30: qty 2

## 2018-11-30 NOTE — ED Provider Notes (Signed)
[facility] EMERGENCY DEPARTMENT Provider Note   CSN: [id number] Arrival date & time: 11/30/18  1513     History   Chief Complaint Chief Complaint  Patient presents with  . Abdominal Pain    HPI Danielle Bonilla is a 48 y.o. female.  HPI   48 year old female with abdominal pain.  Onset yesterday.  Diffuse.  Waxes and wanes but does not completely resolved.  Sometimes occasional sharper pain.  She is also feeling pain in her lower back.  She is felt cold.  Headaches.  Nausea, no vomiting.  No diarrhea.  No urinary complaints.  Past Medical History:  Diagnosis Date  . Anemia   . Asthma   . Bronchitis   . Knee pain   . Menorrhagia   . Uterine fibroid     There are no active problems to display for this patient.   Past Surgical History:  Procedure Laterality Date  . ABDOMINAL HYSTERECTOMY    . TUMOR EXCISION       OB History    Gravida  4   Para  4   Term  4   Preterm      AB      Living        SAB      TAB      Ectopic      Multiple      Live Births               Home Medications    Prior to Admission medications   Medication Sig Start Date End Date Taking? Authorizing Provider  ibuprofen (ADVIL,MOTRIN) 200 MG tablet Take 200 mg by mouth every 6 (six) hours as needed for mild pain or moderate pain.   Yes [provider]    Family History Family History  Problem Relation Age of Onset  . Cancer Father   . Diabetes Father     Social History Social History   Tobacco Use  . Smoking status: Current Every Day Smoker    Packs/day: 2.00    Types: Cigarettes  . Smokeless tobacco: Never Used  Substance Use Topics  . Alcohol use: No  . Drug use: No     Allergies   Pineapple and Latex   Review of Systems Review of Systems  All systems reviewed and negative, other than as noted in HPI.  Physical Exam Updated Vital Signs BP (!) 107/58   Pulse 63   Temp 98.5 F (36.9 C) (Oral)   Resp 18   Ht 5\' 7"  (1.702 m)   Wt  111.1 kg   SpO2 100%   BMI 38.37 kg/m   Physical Exam Vitals signs and nursing note reviewed.  Constitutional:      General: She is not in acute distress.    Appearance: She is well-developed.  HENT:     Head: Normocephalic and atraumatic.  Eyes:     General:        Right eye: No discharge.        Left eye: No discharge.     Conjunctiva/sclera: Conjunctivae normal.  Neck:     Musculoskeletal: Neck supple.  Cardiovascular:     Rate and Rhythm: Normal rate and regular rhythm.     Heart sounds: Normal heart sounds. No murmur. No friction rub. No gallop.   Pulmonary:     Effort: Pulmonary effort is normal. No respiratory distress.     Breath sounds: Normal breath sounds.  Abdominal:     General:  There is no distension.     Palpations: Abdomen is soft. There is no mass.     Tenderness: There is abdominal tenderness. There is no guarding.     Hernia: No hernia is present.  Musculoskeletal:        General: No tenderness.  Skin:    General: Skin is warm and dry.  Neurological:     Mental Status: She is alert.  Psychiatric:        Behavior: Behavior normal.        Thought Content: Thought content normal.      ED Treatments / Results  Labs (all labs ordered are listed, but only abnormal results are displayed) Labs Reviewed  COMPREHENSIVE METABOLIC PANEL - Abnormal; Notable for the following components:      Result Value   CO2 21 (*)    AST 13 (*)    Alkaline Phosphatase 37 (*)    All other components within normal limits  CBC - Abnormal; Notable for the following components:   Hemoglobin 11.4 (*)    Platelets 402 (*)    All other components within normal limits  LIPASE, BLOOD  URINALYSIS, ROUTINE W REFLEX MICROSCOPIC    EKG None  Radiology No results found.   Ct Abdomen Pelvis W Contrast  Result Date: 11/30/2018 CLINICAL DATA:  Abdominal pain, back pain, headache, chills, and nausea starting yesterday. EXAM: CT ABDOMEN AND PELVIS WITH CONTRAST TECHNIQUE:  Multidetector CT imaging of the abdomen and pelvis was performed using the standard protocol following bolus administration of intravenous contrast. CONTRAST:  180mL ISOVUE-300 IOPAMIDOL (ISOVUE-300) INJECTION 61% COMPARISON:  02/26/2015 FINDINGS: Lower chest: Mild dependent changes in the lung bases. Hepatobiliary: Subcentimeter low-attenuation lesions scattered throughout the liver, with 4 separate lesions identified. No change since prior study. Likely cysts. Gallbladder and bile ducts are unremarkable. Pancreas: Unremarkable. No pancreatic ductal dilatation or surrounding inflammatory changes. Spleen: Normal in size without focal abnormality. Adrenals/Urinary Tract: Adrenal glands are unremarkable. Kidneys are normal, without renal calculi, focal lesion, or hydronephrosis. Bladder is decompressed. Stomach/Bowel: Stomach is within normal limits. Appendix appears normal. No evidence of bowel wall thickening, distention, or inflammatory changes. Vascular/Lymphatic: Aortic atherosclerosis. No enlarged abdominal or pelvic lymph nodes. Reproductive: Status post hysterectomy. No adnexal masses. Other: No abdominal wall hernia or abnormality. No abdominopelvic ascites. Musculoskeletal: No acute or significant osseous findings. IMPRESSION: No acute process demonstrated in the abdomen or pelvis. No evidence of bowel obstruction or inflammation. Electronically Signed   By: Lucienne Capers M.D.   On: 11/30/2018 21:20    Procedures Procedures (including critical care time)  Medications Ordered in ED Medications  lactated ringers bolus 1,000 mL (has no administration in time range)  morphine 4 MG/ML injection 8 mg (has no administration in time range)  ondansetron (ZOFRAN) injection 4 mg (has no administration in time range)  ketorolac (TORADOL) 30 MG/ML injection 9.9 mg (has no administration in time range)     Initial Impression / Assessment and Plan / ED Course  I have reviewed the triage vital signs and  the nursing notes.  Pertinent labs & imaging results that were available during my care of the patient were reviewed by me and considered in my medical decision making (see chart for details).     48 year old female with abdominal pain without clear etiology. Gastritis, peptic ulcer disease, biliary colic, cholelithiasis, cholecystitis, cholangitis, hepatitis, renal colic, urinary tract infection, colitis, constipation, gastroenteritis, atypical ACS, mesenteric ischemia all considered among other etiologies in the patient's differential diagnosis.  Work-up fairly unremarkable.  Treated symptomatically with improvement of symptoms.  Repeat abdominal exam still showing some mild diffuse tenderness but certainly no progression prior.  Will treat symptoms with PRN meclizine and Zofran.  Emergent return precautions were discussed.  Outpatient follow-up otherwise.   Final Clinical Impressions(s) / ED Diagnoses   Final diagnoses:  Generalized abdominal pain    ED Discharge Orders         Ordered    dicyclomine (BENTYL) 20 MG tablet  Every 6 hours PRN     11/30/18 2157    ondansetron (ZOFRAN) 4 MG tablet  Every 6 hours     11/30/18 2157           Virgel Manifold, MD 12/10/18 [phone]

## 2018-11-30 NOTE — ED Triage Notes (Signed)
Pt c/o abd pain, back pain, head ache, chiils, and N that started yesterday. Denies V

## 2018-12-20 DIAGNOSIS — Z139 Encounter for screening, unspecified: Secondary | ICD-10-CM

## 2018-12-20 NOTE — Congregational Nurse Program (Signed)
  Dept: [phone]   Congregational Nurse Program Note  Date of Encounter: 12/20/2018  Past Medical History: Past Medical History:  Diagnosis Date  . Anemia   . Asthma   . Bronchitis   . Knee pain   . Menorrhagia   . Uterine fibroid     Encounter Details: Pt presents to clinic for screening referral for PCP and to conduct eligibility for [facility] Program, but was not income eligiible per requirements of [facility] Connect program.    Screenings for blood pressure and blood glucose were conducted and were WNL  Pt was given referrals to [place] and provided the "Know Your Options" health care options since she was also a [employer] employee, that currently has no insurance.  Marland Kitchen

## 2020-09-03 IMAGING — CT CT ABD-PELV W/ CM
2 of 5 series · 16 of 46 positions shown, 18 images · IV contrast (iopamidol)
Comparison: 02/26/2015

CLINICAL DATA: Abdominal pain, back pain, headache, chills, and
nausea starting yesterday.

EXAM:
CT ABDOMEN AND PELVIS WITH CONTRAST
TECHNIQUE: Multidetector CT imaging of the abdomen and pelvis was performed
using the standard protocol following bolus administration of
intravenous contrast.
CONTRAST:  100mL BOO612-PAA IOPAMIDOL (BOO612-PAA) INJECTION 61%

[Series 2: axial st · axial · 0.74mm/px · z∈[+873,+1298]mm · 13 of 96 slices shown, 15 images]
[im 6/96  soft-tissue]
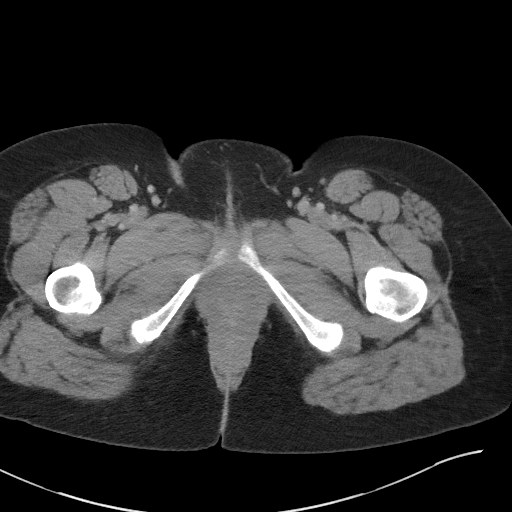
[im 6/96  bone]
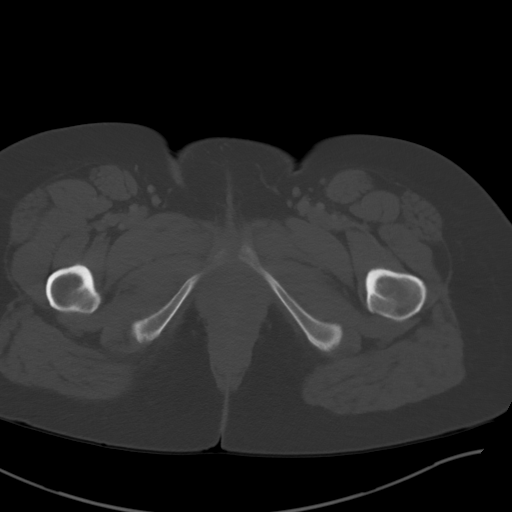
[im 16/96  soft-tissue]
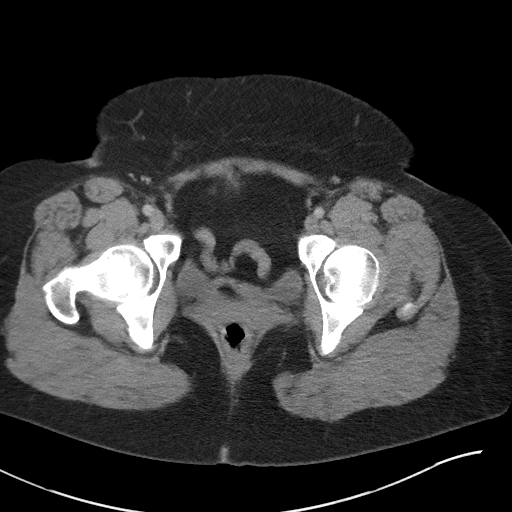
[im 21/96  soft-tissue]
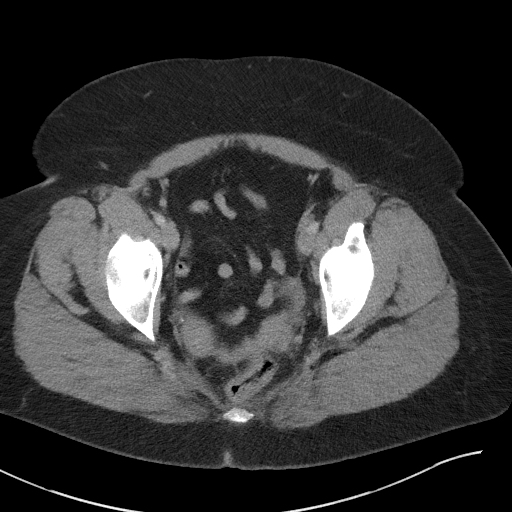
[im 26/96  soft-tissue]
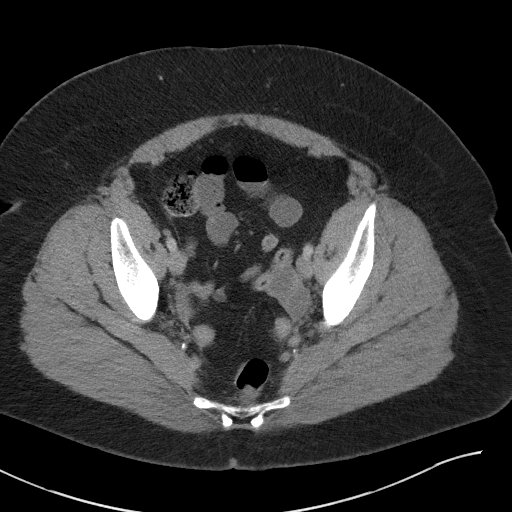
[im 36/96  soft-tissue]
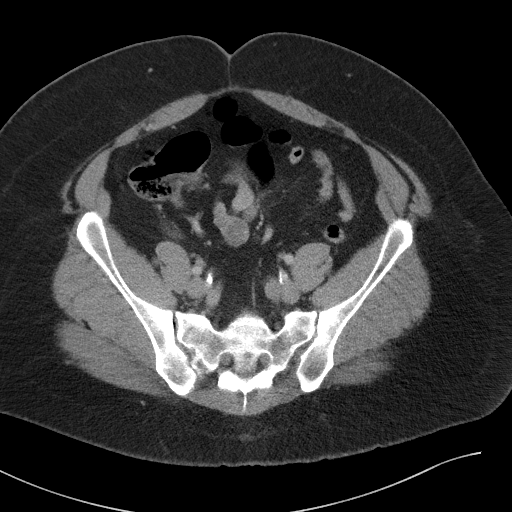
[im 41/96  soft-tissue]
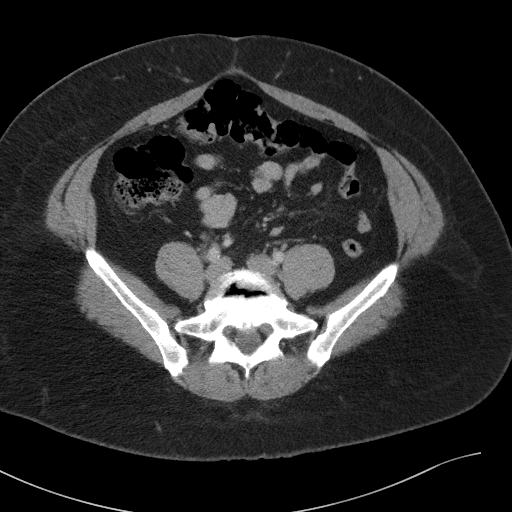
[im 51/96  soft-tissue]
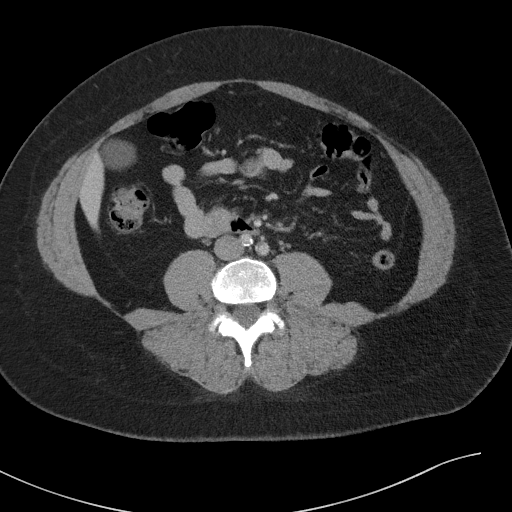
[im 56/96  soft-tissue]
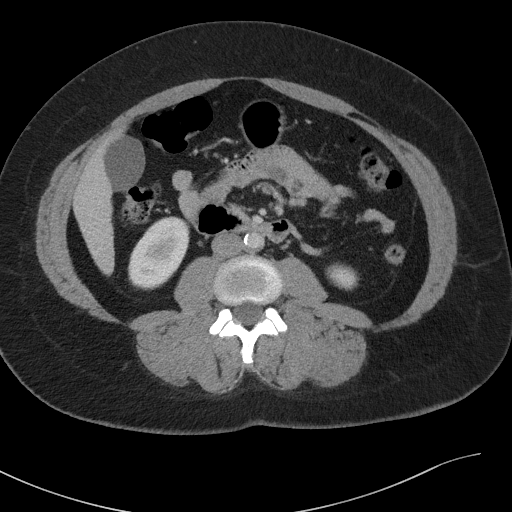
[im 61/96  soft-tissue]
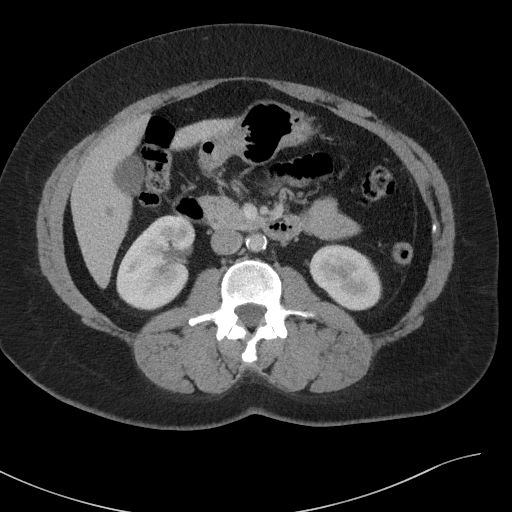
[im 61/96  bone]
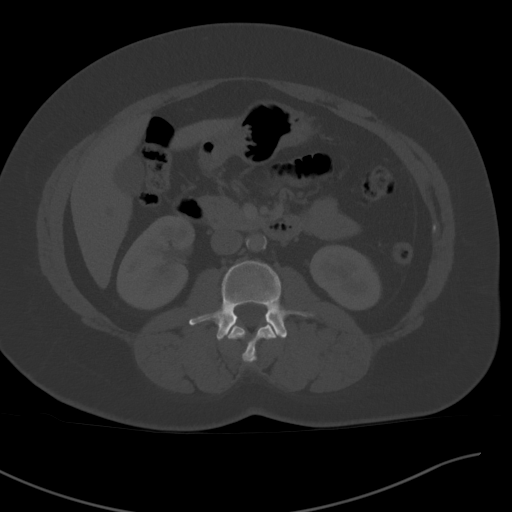
[im 71/96  soft-tissue]
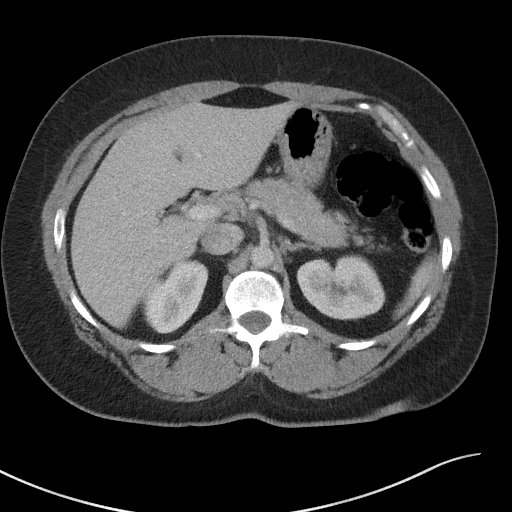
[im 76/96  soft-tissue]
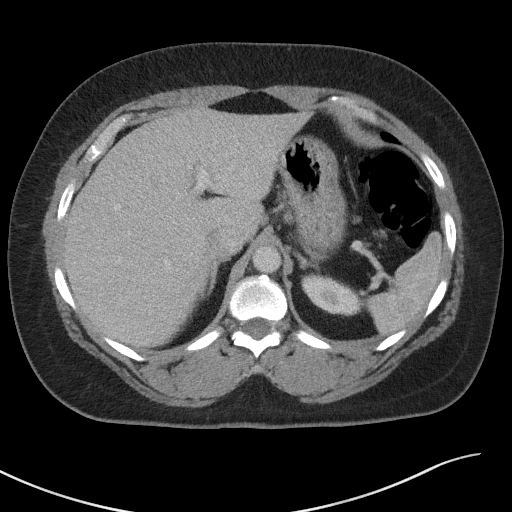
[im 81/96  soft-tissue]
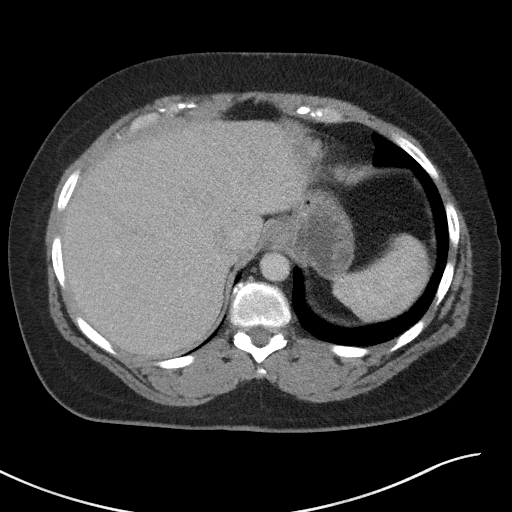
[im 91/96  soft-tissue]
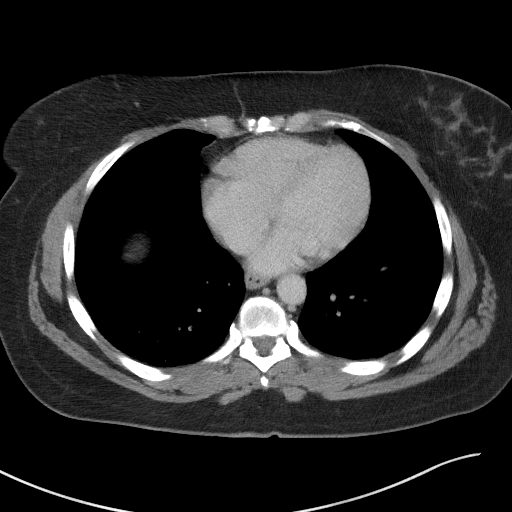

[Series 5: coronal st · coronal · 0.75mm/px · 3 of 99 slices shown]
[im 33/99  soft-tissue]
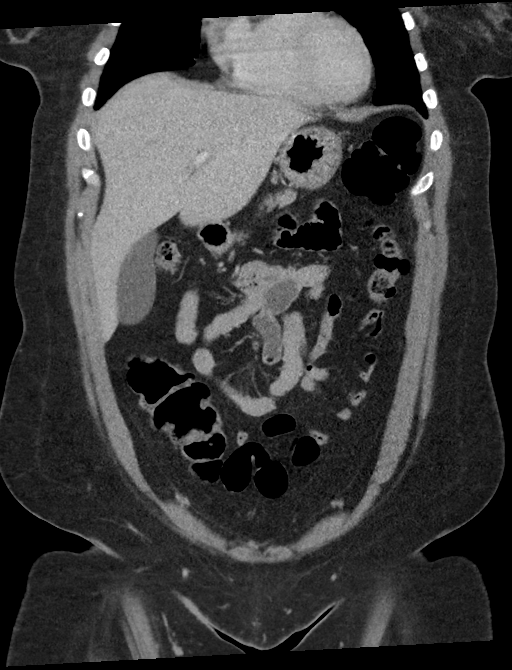
[im 44/99  soft-tissue]
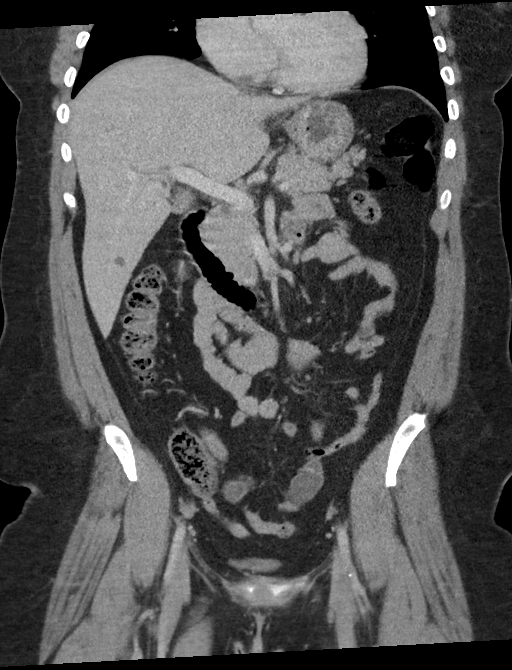
[im 55/99  soft-tissue]
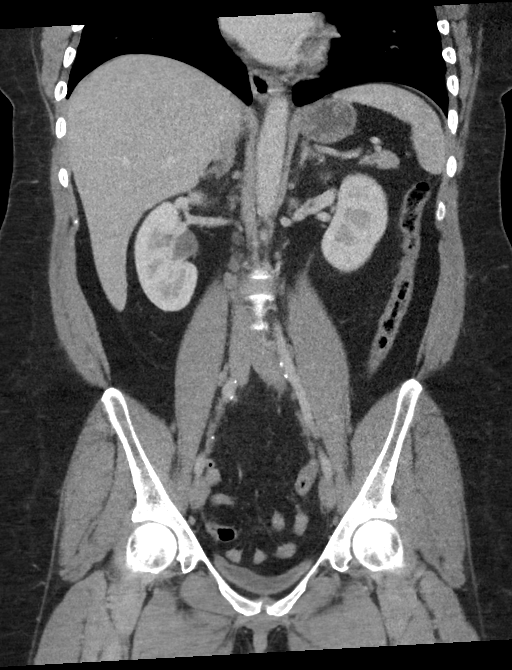

[16 of 46 positions shown; findings below may reference images not displayed]

FINDINGS: Lower chest: Mild dependent changes in the lung bases.

Hepatobiliary: Subcentimeter low-attenuation lesions scattered
throughout the liver, with 4 separate lesions identified. No change
since prior study. Likely cysts. Gallbladder and bile ducts are
unremarkable.

Pancreas: Unremarkable. No pancreatic ductal dilatation or
surrounding inflammatory changes.

Spleen: Normal in size without focal abnormality.

Adrenals/Urinary Tract: Adrenal glands are unremarkable. Kidneys are
normal, without renal calculi, focal lesion, or hydronephrosis.
Bladder is decompressed.

Stomach/Bowel: Stomach is within normal limits. Appendix appears
normal. No evidence of bowel wall thickening, distention, or
inflammatory changes.

Vascular/Lymphatic: Aortic atherosclerosis. No enlarged abdominal or
pelvic lymph nodes.

Reproductive: Status post hysterectomy. No adnexal masses.

Other: No abdominal wall hernia or abnormality. No abdominopelvic
ascites.

Musculoskeletal: No acute or significant osseous findings.
IMPRESSION: No acute process demonstrated in the abdomen or pelvis. No evidence
of bowel obstruction or inflammation.

## 2021-12-10 ENCOUNTER — Encounter (HOSPITAL_COMMUNITY): Payer: Self-pay

## 2021-12-10 ENCOUNTER — Emergency Department (HOSPITAL_COMMUNITY)
Admission: EM | Admit: 2021-12-10 | Discharge: 2021-12-10 | Disposition: A | Discharge status: Home / Self Care | Payer: Self-pay | Attending: Emergency Medicine | Admitting: Emergency Medicine

## 2021-12-10 ENCOUNTER — Other Ambulatory Visit: Payer: Self-pay

## 2021-12-10 DIAGNOSIS — Z91040 Latex allergy status: Secondary | ICD-10-CM | POA: Insufficient documentation

## 2021-12-10 DIAGNOSIS — M7989 Other specified soft tissue disorders: Secondary | ICD-10-CM | POA: Insufficient documentation

## 2021-12-10 DIAGNOSIS — M799 Soft tissue disorder, unspecified: Secondary | ICD-10-CM

## 2021-12-10 NOTE — ED Triage Notes (Signed)
Pt c/o back pain with "knot" on R side. States she's had it for years and has to intermediately have it lanced.

## 2021-12-10 NOTE — ED Notes (Signed)
Pt with fist sized area to mid upper back that has gotten larger and more painful.  Pt denies any drainage or fevers.  Pt states she has had it I&D here in the past.

## 2021-12-10 NOTE — ED Provider Notes (Signed)
[facility] EMERGENCY DEPARTMENT Provider Note   CSN: [id number] Arrival date & time: 12/10/21  1924     History  Chief Complaint  Patient presents with   Back Pain    Danielle Bonilla is a 51 y.o. female.  HPI Patient presents with concern for discomfort of a lesion that is present in her back.  She notes it is been present for a long time, has been drained once previously, she has not followed up with her physician or dermatologist after that.  She notes that she has recently developed enlargement, or an increase in pain in the region, nonradiating, without other systemic complaints.    Home Medications Prior to Admission medications   Medication Sig Start Date End Date Taking? Authorizing Provider  dicyclomine (BENTYL) 20 MG tablet Take 1 tablet (20 mg total) by mouth every 6 (six) hours as needed for spasms. 11/30/18   Virgel Manifold, MD  ibuprofen (ADVIL,MOTRIN) 200 MG tablet Take 200 mg by mouth every 6 (six) hours as needed for mild pain or moderate pain.    [provider]  ondansetron (ZOFRAN) 4 MG tablet Take 1 tablet (4 mg total) by mouth every 6 (six) hours. 11/30/18   Virgel Manifold, MD      Allergies    Pineapple and Latex    Review of Systems   Review of Systems  Constitutional:        Per HPI, otherwise negative  HENT:         Per HPI, otherwise negative  Respiratory:         Per HPI, otherwise negative  Cardiovascular:        Per HPI, otherwise negative  Gastrointestinal:  Negative for vomiting.  Endocrine:       Negative aside from HPI  Genitourinary:        Neg aside from HPI   Musculoskeletal:        Per HPI, otherwise negative  Skin: Negative.   Neurological:  Negative for syncope.   Physical Exam Updated Vital Signs BP 135/83 (BP Location: Right Arm)    Pulse 77    Temp 97.9 F (36.6 C)    Resp 16    Ht 5\' 7"  (1.702 m)    Wt 113.4 kg    SpO2 100%    BMI 39.16 kg/m  Physical Exam Vitals and nursing note reviewed.  Constitutional:       General: She is not in acute distress.    Appearance: She is well-developed.  HENT:     Head: Normocephalic and atraumatic.  Eyes:     Conjunctiva/sclera: Conjunctivae normal.  Cardiovascular:     Rate and Rhythm: Normal rate and regular rhythm.  Pulmonary:     Effort: Pulmonary effort is normal. No respiratory distress.     Breath sounds: Normal breath sounds. No stridor.  Abdominal:     General: There is no distension.  Musculoskeletal:       Arms:  Skin:    General: Skin is warm and dry.  Neurological:     Mental Status: She is alert and oriented to person, place, and time.     Cranial Nerves: No cranial nerve deficit.    ED Results / Procedures / Treatments   Labs (all labs ordered are listed, but only abnormal results are displayed) Labs Reviewed - No data to display  EKG None  Radiology No results found.  Procedures Procedures    Medications Ordered in ED Medications - No data to  display  ED Course/ Medical Decision Making/ A&P Adult female presents in no distress, awake, alert, hemodynamically unremarkable, concern for discomfort from soft tissue lesion that by her account has been present for a long time.  No evidence for deep space involvement, no increased work of breathing, no evidence for infection, no evidence for infection.  Though the patient initially requested drainage, after we lengthy conversation on the likelihood of lipoma versus cyst, importance of appropriate excision of the entirety of the lesion for complete resolution, the patient was amenable to discharge with outpatient dermatology follow-up. Final Clinical Impression(s) / ED Diagnoses Final diagnoses:  Lesion of soft tissue    Rx / DC Orders ED Discharge Orders     None         Carmin Muskrat, MD 12/10/21 2033

## 2021-12-10 NOTE — Discharge Instructions (Signed)
As discussed, today's evaluation demonstrated the presence of a soft tissue lesion, likely either a lipoma or a cyst on your back.  It is important you follow-up with our dermatology colleagues for appropriate management, which may include excision of the lesion.  Use Tylenol, ibuprofen for pain control.  Return here for concerning changes in your condition.

## 2021-12-10 NOTE — ED Notes (Signed)
ED Provider at bedside. 

## 2022-12-06 DIAGNOSIS — L02212 Cutaneous abscess of back [any part, except buttock]: Secondary | ICD-10-CM | POA: Diagnosis not present

## 2022-12-06 DIAGNOSIS — D171 Benign lipomatous neoplasm of skin and subcutaneous tissue of trunk: Secondary | ICD-10-CM | POA: Diagnosis not present

## 2022-12-06 DIAGNOSIS — R69 Illness, unspecified: Secondary | ICD-10-CM | POA: Diagnosis not present

## 2022-12-06 DIAGNOSIS — R222 Localized swelling, mass and lump, trunk: Secondary | ICD-10-CM | POA: Diagnosis not present

## 2023-01-19 DIAGNOSIS — Z91040 Latex allergy status: Secondary | ICD-10-CM | POA: Diagnosis not present

## 2023-01-19 DIAGNOSIS — R0789 Other chest pain: Secondary | ICD-10-CM | POA: Diagnosis not present

## 2023-01-19 DIAGNOSIS — I700 Atherosclerosis of aorta: Secondary | ICD-10-CM | POA: Diagnosis not present

## 2023-01-19 DIAGNOSIS — R0781 Pleurodynia: Secondary | ICD-10-CM | POA: Diagnosis not present

## 2023-01-19 DIAGNOSIS — F17210 Nicotine dependence, cigarettes, uncomplicated: Secondary | ICD-10-CM | POA: Diagnosis not present

## 2023-01-19 DIAGNOSIS — Z91018 Allergy to other foods: Secondary | ICD-10-CM | POA: Diagnosis not present

## 2023-06-15 DIAGNOSIS — R1032 Left lower quadrant pain: Secondary | ICD-10-CM | POA: Diagnosis not present

## 2023-06-15 DIAGNOSIS — F17210 Nicotine dependence, cigarettes, uncomplicated: Secondary | ICD-10-CM | POA: Diagnosis not present

## 2023-06-15 DIAGNOSIS — Z8709 Personal history of other diseases of the respiratory system: Secondary | ICD-10-CM | POA: Diagnosis not present

## 2023-06-15 DIAGNOSIS — M1990 Unspecified osteoarthritis, unspecified site: Secondary | ICD-10-CM | POA: Diagnosis not present

## 2023-06-15 DIAGNOSIS — R109 Unspecified abdominal pain: Secondary | ICD-10-CM | POA: Diagnosis not present

## 2023-06-15 DIAGNOSIS — R1030 Lower abdominal pain, unspecified: Secondary | ICD-10-CM | POA: Diagnosis not present

## 2023-06-15 DIAGNOSIS — R1031 Right lower quadrant pain: Secondary | ICD-10-CM | POA: Diagnosis not present

## 2023-06-15 DIAGNOSIS — R350 Frequency of micturition: Secondary | ICD-10-CM | POA: Diagnosis not present

## 2023-06-15 DIAGNOSIS — K7689 Other specified diseases of liver: Secondary | ICD-10-CM | POA: Diagnosis not present

## 2023-06-15 DIAGNOSIS — R3915 Urgency of urination: Secondary | ICD-10-CM | POA: Diagnosis not present

## 2023-07-28 ENCOUNTER — Emergency Department (HOSPITAL_COMMUNITY): Payer: Self-pay

## 2023-07-28 ENCOUNTER — Other Ambulatory Visit: Payer: Self-pay

## 2023-07-28 ENCOUNTER — Emergency Department (HOSPITAL_COMMUNITY)
Admission: EM | Admit: 2023-07-28 | Discharge: 2023-07-28 | Disposition: A | Discharge status: Home / Self Care | Payer: Self-pay | Attending: Emergency Medicine | Admitting: Emergency Medicine

## 2023-07-28 ENCOUNTER — Encounter (HOSPITAL_COMMUNITY): Payer: Self-pay | Admitting: *Deleted

## 2023-07-28 DIAGNOSIS — K7689 Other specified diseases of liver: Secondary | ICD-10-CM | POA: Diagnosis not present

## 2023-07-28 DIAGNOSIS — R519 Headache, unspecified: Secondary | ICD-10-CM | POA: Diagnosis not present

## 2023-07-28 DIAGNOSIS — R0989 Other specified symptoms and signs involving the circulatory and respiratory systems: Secondary | ICD-10-CM | POA: Diagnosis not present

## 2023-07-28 DIAGNOSIS — R0789 Other chest pain: Secondary | ICD-10-CM | POA: Insufficient documentation

## 2023-07-28 DIAGNOSIS — Z91040 Latex allergy status: Secondary | ICD-10-CM | POA: Insufficient documentation

## 2023-07-28 DIAGNOSIS — N200 Calculus of kidney: Secondary | ICD-10-CM | POA: Diagnosis not present

## 2023-07-28 DIAGNOSIS — R55 Syncope and collapse: Secondary | ICD-10-CM | POA: Insufficient documentation

## 2023-07-28 DIAGNOSIS — Z20822 Contact with and (suspected) exposure to covid-19: Secondary | ICD-10-CM | POA: Diagnosis not present

## 2023-07-28 LAB — CBC WITH DIFFERENTIAL/PLATELET
Abs Immature Granulocytes: 0.01 10*3/uL (ref 0.00–0.07)
Basophils Absolute: 0.1 10*3/uL (ref 0.0–0.1)
Basophils Relative: 1 %
Eosinophils Absolute: 0.3 10*3/uL (ref 0.0–0.5)
Eosinophils Relative: 4 %
HCT: 32.9 % — ABNORMAL LOW (ref 36.0–46.0)
Hemoglobin: 9.8 g/dL — ABNORMAL LOW (ref 12.0–15.0)
Immature Granulocytes: 0 %
Lymphocytes Relative: 38 %
Lymphs Abs: 2.5 10*3/uL (ref 0.7–4.0)
MCH: 22.7 pg — ABNORMAL LOW (ref 26.0–34.0)
MCHC: 29.8 g/dL — ABNORMAL LOW (ref 30.0–36.0)
MCV: 76.2 fL — ABNORMAL LOW (ref 80.0–100.0)
Monocytes Absolute: 0.3 10*3/uL (ref 0.1–1.0)
Monocytes Relative: 5 %
Neutro Abs: 3.5 10*3/uL (ref 1.7–7.7)
Neutrophils Relative %: 52 %
Platelets: 401 10*3/uL — ABNORMAL HIGH (ref 150–400)
RBC: 4.32 MIL/uL (ref 3.87–5.11)
RDW: 18.5 % — ABNORMAL HIGH (ref 11.5–15.5)
WBC: 6.7 10*3/uL (ref 4.0–10.5)
nRBC: 0 % (ref 0.0–0.2)

## 2023-07-28 LAB — COMPREHENSIVE METABOLIC PANEL
ALT: 12 U/L (ref 0–44)
AST: 14 U/L — ABNORMAL LOW (ref 15–41)
Albumin: 4 g/dL (ref 3.5–5.0)
Alkaline Phosphatase: 44 U/L (ref 38–126)
Anion gap: 11 (ref 5–15)
BUN: 10 mg/dL (ref 6–20)
CO2: 23 mmol/L (ref 22–32)
Calcium: 9.2 mg/dL (ref 8.9–10.3)
Chloride: 105 mmol/L (ref 98–111)
Creatinine, Ser: 0.94 mg/dL (ref 0.44–1.00)
GFR, Estimated: >60 mL/min (ref >60)
Glucose, Bld: 92 mg/dL (ref 70–99)
Potassium: 3.5 mmol/L (ref 3.5–5.1)
Sodium: 139 mmol/L (ref 135–145)
Total Bilirubin: 0.6 mg/dL (ref 0.3–1.2)
Total Protein: 7.5 g/dL (ref 6.5–8.1)

## 2023-07-28 LAB — URINALYSIS, W/ REFLEX TO CULTURE (INFECTION SUSPECTED)
Bilirubin Urine: NEGATIVE
Glucose, UA: NEGATIVE mg/dL
Hgb urine dipstick: NEGATIVE
Ketones, ur: NEGATIVE mg/dL
Nitrite: NEGATIVE
Protein, ur: 100 mg/dL — AB
Specific Gravity, Urine: 1.028 (ref 1.005–1.030)
WBC, UA: >50 WBC/hpf (ref 0–5)
pH: 6 (ref 5.0–8.0)

## 2023-07-28 LAB — CBG MONITORING, ED: Glucose-Capillary: 100 mg/dL — ABNORMAL HIGH (ref 70–99)

## 2023-07-28 LAB — LIPASE, BLOOD: Lipase: 26 U/L (ref 11–51)

## 2023-07-28 LAB — D-DIMER, QUANTITATIVE: D-Dimer, Quant: 0.35 ug{FEU}/mL (ref 0.00–0.50)

## 2023-07-28 LAB — TROPONIN I (HIGH SENSITIVITY)
Troponin I (High Sensitivity): <2 ng/L (ref <18)
Troponin I (High Sensitivity): <2 ng/L (ref <18)

## 2023-07-28 LAB — SARS CORONAVIRUS 2 BY RT PCR: SARS Coronavirus 2 by RT PCR: NEGATIVE

## 2023-07-28 MED ORDER — IOHEXOL 300 MG/ML  SOLN
100.0000 mL | Freq: Once | INTRAMUSCULAR | Status: AC | PRN
  Administered 2023-07-28: 100 mL via INTRAVENOUS

## 2023-07-28 MED ORDER — ASPIRIN 81 MG PO CHEW
324.0000 mg | CHEWABLE_TABLET | Freq: Once | ORAL | Status: AC
  Administered 2023-07-28: 324 mg via ORAL
  Filled 2023-07-28: qty 4

## 2023-07-28 MED ORDER — LACTATED RINGERS IV BOLUS
1000.0000 mL | Freq: Once | INTRAVENOUS | Status: AC
  Administered 2023-07-28: 1000 mL via INTRAVENOUS

## 2023-07-28 NOTE — ED Provider Notes (Signed)
[place] EMERGENCY DEPARTMENT AT [facility] Provider Note   CSN: [id number] Arrival date & time: 07/28/23  1555     History  Chief Complaint  Patient presents with   Loss of Consciousness    Danielle Bonilla is a 52 y.o. female.  HPI 52 year old female presents with syncope.  Prior to arrival she was sitting on the couch talking to a friend about her son.  She all of a sudden felt sweaty and had tightness in the middle of her chest.  Next and she knows she was waking up in the emergency department.  The friend she was talking to stated that she passed out but did not appear to have seizure-like activity.  Patient states she has a slight headache right now but nothing too bad.  She continues to have chest tightness.  She denies any focal weakness.  Does not think she bit her tongue.  She has been dealing with some lower abdominal discomfort all day and rates it as mild.  No urinary symptoms, diarrhea, constipation.  Home Medications Prior to Admission medications   Medication Sig Start Date End Date Taking? Authorizing Provider  acetaminophen (TYLENOL) 325 MG tablet Take 325 mg by mouth every 4 (four) hours as needed for mild pain.   Yes [provider]  loratadine (CLARITIN) 10 MG tablet Take 10 mg by mouth daily.   Yes [provider]      Allergies    Pineapple and Latex    Review of Systems   Review of Systems  Constitutional:  Positive for diaphoresis.  HENT:  Negative for sore throat.   Respiratory:  Positive for chest tightness. Negative for cough.   Gastrointestinal:  Positive for abdominal pain. Negative for constipation, diarrhea and vomiting.  Genitourinary:  Negative for dysuria.  Neurological:  Positive for syncope.    Physical Exam Updated Vital Signs BP 132/78   Pulse 64   Temp 98.3 F (36.8 C)   Resp 12   Ht 5\' 7"  (1.702 m)   Wt 108.9 kg   SpO2 99%   BMI 37.59 kg/m  Physical Exam Vitals and nursing note reviewed.   Constitutional:      Appearance: She is well-developed.  HENT:     Head: Normocephalic and atraumatic.     Mouth/Throat:     Comments: No tongue injury Eyes:     Extraocular Movements: Extraocular movements intact.     Pupils: Pupils are equal, round, and reactive to light.  Cardiovascular:     Rate and Rhythm: Normal rate and regular rhythm.     Heart sounds: Normal heart sounds. No murmur heard. Pulmonary:     Effort: Pulmonary effort is normal.     Breath sounds: Normal breath sounds.  Abdominal:     Palpations: Abdomen is soft.     Tenderness: There is abdominal tenderness in the right lower quadrant, suprapubic area and left lower quadrant.  Musculoskeletal:     Cervical back: No rigidity.  Skin:    General: Skin is warm and dry.  Neurological:     Mental Status: She is alert.     Comments: No facial droop or slurred speech. 5/5 strength in all 4 extremities.    ED Results / Procedures / Treatments   Labs (all labs ordered are listed, but only abnormal results are displayed) Labs Reviewed  CBC WITH DIFFERENTIAL/PLATELET - Abnormal; Notable for the following components:      Result Value   Hemoglobin 9.8 (*)  HCT 32.9 (*)    MCV 76.2 (*)    MCH 22.7 (*)    MCHC 29.8 (*)    RDW 18.5 (*)    Platelets 401 (*)    All other components within normal limits  COMPREHENSIVE METABOLIC PANEL - Abnormal; Notable for the following components:   AST 14 (*)    All other components within normal limits  URINALYSIS, W/ REFLEX TO CULTURE (INFECTION SUSPECTED) - Abnormal; Notable for the following components:   APPearance CLOUDY (*)    Protein, ur 100 (*)    Leukocytes,Ua LARGE (*)    Bacteria, UA MANY (*)    All other components within normal limits  CBG MONITORING, ED - Abnormal; Notable for the following components:   Glucose-Capillary 100 (*)    All other components within normal limits  SARS CORONAVIRUS 2 BY RT PCR  LIPASE, BLOOD  D-DIMER, QUANTITATIVE  TROPONIN I  (HIGH SENSITIVITY)  TROPONIN I (HIGH SENSITIVITY)    EKG EKG Interpretation Date/Time:  Tuesday July 28 2023 18:23:51 EDT Ventricular Rate:  66 PR Interval:  146 QRS Duration:  89 QT Interval:  431 QTC Calculation: 452 R Axis:   36  Text Interpretation: Sinus rhythm Low voltage, precordial leads similar to earlier in the day Confirmed by Pricilla Loveless [phone]) on 07/28/2023 8:12:36 PM  Radiology CT ABDOMEN PELVIS W CONTRAST  Result Date: 07/28/2023 CLINICAL DATA:  Abdominal pain, acute, nonlocalized. Syncopal episode. Tightness in chest, urinary incontinence. EXAM: CT ABDOMEN AND PELVIS WITH CONTRAST TECHNIQUE: Multidetector CT imaging of the abdomen and pelvis was performed using the standard protocol following bolus administration of intravenous contrast. RADIATION DOSE REDUCTION: This exam was performed according to the departmental dose-optimization program which includes automated exposure control, adjustment of the mA and/or kV according to patient size and/or use of iterative reconstruction technique. CONTRAST:  OMNIPAQUE IOHEXOL 300 MG/ML  SOLN COMPARISON:  06/15/2023. FINDINGS: Lower chest: Mild atelectasis is present at the lung bases. Hepatobiliary: Hypodensities are present in the liver, largest in the left lobe measuring 1.6 cm has attenuation of a cyst. No biliary ductal dilatation. The gallbladder is without stones. Pancreas: Unremarkable. No pancreatic ductal dilatation or surrounding inflammatory changes. Spleen: Normal in size without focal abnormality. Adrenals/Urinary Tract: The adrenal glands are within normal limits. The kidneys enhance symmetrically. A subcentimeter hypodensity is noted in the right kidney which is too small to further characterize. Nonobstructive renal calculi are present bilaterally. No hydronephrosis bilaterally. The bladder is unremarkable. Stomach/Bowel: Stomach is within normal limits. Appendix appears normal. No evidence of bowel wall  thickening, distention, or inflammatory changes. No free air or pneumatosis. Vascular/Lymphatic: Aortic atherosclerosis. No enlarged abdominal or pelvic lymph nodes. Reproductive: Status post hysterectomy. No adnexal masses. Other: No abdominopelvic ascites. Musculoskeletal: Mild degenerative changes are present in the thoracolumbar spine. No acute osseous abnormality. IMPRESSION: 1. No acute intra-abdominal process. 2. Bilateral nephrolithiasis. 3. Aortic atherosclerosis. Electronically Signed   By: Thornell Sartorius M.D.   On: 07/28/2023 20:39   DG Chest Portable 1 View  Result Date: 07/28/2023 CLINICAL DATA:  Syncope. EXAM: PORTABLE CHEST 1 VIEW COMPARISON:  June 15, 2023 FINDINGS: The heart size and mediastinal contours are within normal limits. Low lung volumes are noted. There is no evidence of an acute infiltrate, pleural effusion or pneumothorax. The visualized skeletal structures are unremarkable. IMPRESSION: Low lung volumes without acute or active cardiopulmonary disease. Electronically Signed   By: Aram Candela M.D.   On: 07/28/2023 20:01    Procedures Procedures  Medications Ordered in ED Medications  lactated ringers bolus 1,000 mL (0 mLs Intravenous Stopped 07/28/23 1814)  aspirin chewable tablet 324 mg (324 mg Oral Given 07/28/23 1702)  iohexol (OMNIPAQUE) 300 MG/ML solution 100 mL (100 mLs Intravenous Contrast Given 07/28/23 2001)    ED Course/ Medical Decision Making/ A&P                                 Medical Decision Making Amount and/or Complexity of Data Reviewed External Data Reviewed: labs.    Details: Hgb 10.1 in July.  Contaminated urine sample.  Negative troponins. Labs: ordered.    Details: Hemoglobin 9.8, essentially unchanged since July in Care everywhere. Radiology: ordered and independent interpretation performed.    Details: No intra-abdominal emergency ECG/medicine tests: ordered and independent interpretation performed.    Details: Normal QTc, no  arrhythmia.  No ischemia  Risk OTC drugs. Prescription drug management.   Patient presents with syncope on the couch.  She states she was discussing her son with her friend and this is a stressful surgery for her as her son is deceased.  I think this is what likely contributed to her episode today.  Does not seem like she had any seizure-like activity.  Otherwise has some chest tightness but negative troponins, normal D-dimer, unremarkable EKG.  Low suspicion for ACS, PE, dissection.  No thunderclap headache and no focal neurodeficits.  He is complaining of some lower abdominal pain which looks like it has been an issue on and off with chart review.  CT unremarkable.  No urinary symptoms and the urine is contaminated.  She otherwise is now feeling well and I think stable for discharge home with return precautions.        Final Clinical Impression(s) / ED Diagnoses Final diagnoses:  Syncope and collapse    Rx / DC Orders ED Discharge Orders     None         Pricilla Loveless, MD 07/28/23 2340

## 2023-07-28 NOTE — ED Triage Notes (Signed)
Pt BIB [facility] for c/o syncopal episode; family reported to ems that pt was sitting on couch when she started "shaking and passed out"  Pt c/o tightness to her chest   Pt had incontinence of urine  Cbg with ems 139

## 2023-07-28 NOTE — Discharge Instructions (Signed)
It is unclear why you passed out today.  It may be related to stress.  Be sure you are taking your home medicines as prescribed, drinking plenty of fluids and eating appropriately, and you should also follow-up with your primary care physician.  If you do not have a primary care physician, you can use the resources at the end of this discharge paperwork to help find one.  If you develop recurrent symptoms, chest pain, headache, abdominal pain, vomiting, or any other new/concerning symptoms then return to the ER or call 911.

## 2023-07-28 NOTE — ED Notes (Signed)
Pt's daughter, Joylene Igo [phone], would like updates as things result.

## 2023-12-01 DIAGNOSIS — M25511 Pain in right shoulder: Secondary | ICD-10-CM | POA: Diagnosis not present

## 2023-12-01 DIAGNOSIS — J40 Bronchitis, not specified as acute or chronic: Secondary | ICD-10-CM | POA: Diagnosis not present

## 2023-12-01 DIAGNOSIS — Z90710 Acquired absence of both cervix and uterus: Secondary | ICD-10-CM | POA: Diagnosis not present

## 2023-12-01 DIAGNOSIS — F17210 Nicotine dependence, cigarettes, uncomplicated: Secondary | ICD-10-CM | POA: Diagnosis not present

## 2023-12-01 DIAGNOSIS — Z91040 Latex allergy status: Secondary | ICD-10-CM | POA: Diagnosis not present

## 2023-12-01 DIAGNOSIS — M19012 Primary osteoarthritis, left shoulder: Secondary | ICD-10-CM | POA: Diagnosis not present
